# Patient Record
Sex: Female | Born: 1960 | Race: White | Hispanic: No | Marital: Married | Smoking: Never smoker
Health system: Southern US, Community
[De-identification: ages and names within clinical notes are randomized; demographics above are authoritative.]

## PROBLEM LIST (undated history)

## (undated) DIAGNOSIS — L309 Dermatitis, unspecified: Secondary | ICD-10-CM

## (undated) DIAGNOSIS — M1990 Unspecified osteoarthritis, unspecified site: Secondary | ICD-10-CM

## (undated) DIAGNOSIS — I10 Essential (primary) hypertension: Secondary | ICD-10-CM

## (undated) DIAGNOSIS — F988 Other specified behavioral and emotional disorders with onset usually occurring in childhood and adolescence: Secondary | ICD-10-CM

## (undated) DIAGNOSIS — F329 Major depressive disorder, single episode, unspecified: Secondary | ICD-10-CM

## (undated) DIAGNOSIS — F32A Depression, unspecified: Secondary | ICD-10-CM

## (undated) DIAGNOSIS — Z9889 Other specified postprocedural states: Secondary | ICD-10-CM

## (undated) DIAGNOSIS — T4145XA Adverse effect of unspecified anesthetic, initial encounter: Secondary | ICD-10-CM

## (undated) DIAGNOSIS — R112 Nausea with vomiting, unspecified: Secondary | ICD-10-CM

## (undated) DIAGNOSIS — T8859XA Other complications of anesthesia, initial encounter: Secondary | ICD-10-CM

## (undated) HISTORY — PX: KNEE ARTHROSCOPY: SUR90

## (undated) HISTORY — PX: ABDOMINAL HYSTERECTOMY: SHX81

## (undated) HISTORY — PX: TUBAL LIGATION: SHX77

## (undated) HISTORY — PX: COLONOSCOPY: SHX174

---

## 2001-04-01 ENCOUNTER — Other Ambulatory Visit: Admission: RE | Admit: 2001-04-01 | Discharge: 2001-04-01 | Payer: Self-pay | Admitting: Family Medicine

## 2002-11-18 ENCOUNTER — Encounter: Admission: RE | Admit: 2002-11-18 | Discharge: 2002-11-18 | Payer: Self-pay | Admitting: Family Medicine

## 2002-11-18 ENCOUNTER — Encounter: Payer: Self-pay | Admitting: Family Medicine

## 2003-05-03 ENCOUNTER — Encounter: Admission: RE | Admit: 2003-05-03 | Discharge: 2003-05-03 | Payer: Self-pay | Admitting: Family Medicine

## 2003-06-29 ENCOUNTER — Other Ambulatory Visit: Admission: RE | Admit: 2003-06-29 | Discharge: 2003-06-29 | Payer: Self-pay | Admitting: Family Medicine

## 2004-08-11 ENCOUNTER — Other Ambulatory Visit: Admission: RE | Admit: 2004-08-11 | Discharge: 2004-08-11 | Payer: Self-pay | Admitting: Family Medicine

## 2005-10-02 ENCOUNTER — Other Ambulatory Visit: Admission: RE | Admit: 2005-10-02 | Discharge: 2005-10-02 | Payer: Self-pay | Admitting: Family Medicine

## 2006-10-15 ENCOUNTER — Other Ambulatory Visit: Admission: RE | Admit: 2006-10-15 | Discharge: 2006-10-15 | Payer: Self-pay | Admitting: Family Medicine

## 2007-03-12 ENCOUNTER — Ambulatory Visit (HOSPITAL_COMMUNITY): Admission: RE | Admit: 2007-03-12 | Discharge: 2007-03-12 | Payer: Self-pay | Admitting: *Deleted

## 2007-03-12 ENCOUNTER — Encounter (INDEPENDENT_AMBULATORY_CARE_PROVIDER_SITE_OTHER): Payer: Self-pay | Admitting: *Deleted

## 2010-11-07 NOTE — Op Note (Signed)
NAMEJODELL, Taylor [name]              ACCOUNT NO.:  [id number]   MEDICAL RECORD NO.:  [id number]          PATIENT TYPE:  AMB   LOCATION:  SDC                           FACILITY:  [facility]   PHYSICIAN:  Marietta B. Earlene Plater, M.D.  DATE OF BIRTH:  1960/09/11   DATE OF PROCEDURE:  03/12/2007  DATE OF DISCHARGE:                               OPERATIVE REPORT   PREOPERATIVE DIAGNOSIS:  Abnormal uterine bleeding.   POSTOPERATIVE DIAGNOSIS:  Abnormal uterine bleeding.   PROCEDURE PERFORMED:  Total laparoscopic hysterectomy.   SURGEON:  Chester Holstein. Earlene Plater, M.D.   ASSISTANTMarcial Pacas P. Fontaine, M.D.   ANESTHESIA:  General.   FINDINGS:  Approximately 12 weeks size uterus consistent with  adenomyosis.   SPECIMEN:  Uterus and cervix to pathology.   BLOOD LOSS:  150 mL.   COMPLICATIONS:  None.   INDICATIONS:  The patient has a history of excessive menstrual bleeding.  Sonohysterogram was normal and there was no evidence of uterine  fibroids, although the uterus was about 12 weeks size.  Clinical  impression was adenomyosis.  The patient was offered a range of  therapeutic options from birth control pills to Progestrone IUD to  endometrial ablation and ultimately hysterectomy.  The patient chose  hysterectomy.  The patient was advised the risks of surgery, including  infection, bleeding, and damage to surrounding organs.   PROCEDURE:  The patient was taken to the operating room and general  anesthesia obtained.  She was prepped and draped in a standard fashion.  A Foley catheter was inserted into the bladder.  A 10 mm incision was  placed in the umbilicus, the fascia divided sharply and elevated.  The  posterior sheath elevated and entered sharply.  A pursestring suture of  0 Vicryl placed around the fascial defect.  Hassan cannula inserted and  secured.  Pneumoperitoneum obtained with CO2 gas. 11 mm XL trocar placed  in left lower quadrant and a 5 mm in the right, each under direct  laparoscopic  visualization.  The uterus was mobilized anteriorly with  the Rumi. The uterus was previously sounded to 8 cm and the #8 Rumi tip  inserted and secured.   The course of the each ureter was identified and found to be well away.  The left round ligament was placed on traction, sealed and divided with  the gyrus cutting forceps.  Tube and the utero-ovarian pedicles  similarly sealed and divided.  Bladder flap created sharply with the  gyrus.  The uterine artery was skeletonized, sealed and divided on each  side with the gyrus.   The uterus was deviated posteriorly and the Kohcup elevated, anterior  colpotomy made with the plasma spatula.  The uterus was then deviated  anteriorly and posterior colpotomy made with the spatula.  The angles  were taken down the gyrus bipolar with hemostasis obtained.  The uterus  was delivered via the vagina and left in the uterus to maintain  pneumoperitoneum.  The vagina was then closed with interrupted plain  sutures of 0 Vicryl with hemostasis obtained.  The pelvis was irrigated.  The pneumoperitoneum was  taken down and the cuff inspected and found to  be hemostatic.   The inferior ports were removed and their sites were hemostatic.  The  scope was removed, gas released, Hassan cannula removed.  The umbilical  incision was elevated with the [employer] retractors.  Pursestring suture  was snugged down.  This obliterated the fascial defect and no intra-  abdominal contents herniated through prior to closure.  The subcutaneous  tissue was reapproximated with 4-0 Vicryl.  The skin was closed at each  site with Dermabond.  The patient tolerated the procedure well and there  were no complications.  The patient was taken to the recovery room in  stable condition.      Gerri Spore B. Earlene Plater, M.D.  Electronically Signed     WBD/MEDQ  D:  03/12/2007  T:  03/12/2007  Job:  [id number]

## 2011-04-05 LAB — CBC
HCT: 39.8
Hemoglobin: 13.4
MCHC: 33.6
MCV: 87.1
Platelets: 430 — ABNORMAL HIGH
RBC: 4.57
RDW: 13.4
WBC: 7.3

## 2011-04-05 LAB — DIFFERENTIAL
Basophils Absolute: 0
Basophils Relative: 1
Eosinophils Absolute: 0.1
Eosinophils Relative: 1
Lymphocytes Relative: 33
Lymphs Abs: 2.4
Monocytes Absolute: 0.6
Monocytes Relative: 8
Neutro Abs: 4.2
Neutrophils Relative %: 57

## 2011-04-05 LAB — PREGNANCY, URINE: Preg Test, Ur: NEGATIVE

## 2012-03-13 ENCOUNTER — Other Ambulatory Visit: Payer: Self-pay | Admitting: Family Medicine

## 2012-03-13 DIAGNOSIS — IMO0002 Reserved for concepts with insufficient information to code with codable children: Secondary | ICD-10-CM

## 2012-03-14 ENCOUNTER — Ambulatory Visit
Admission: RE | Admit: 2012-03-14 | Discharge: 2012-03-14 | Disposition: A | Discharge status: Home / Self Care | Payer: 59 | Source: Ambulatory Visit | Attending: Family Medicine | Admitting: Family Medicine

## 2012-03-14 DIAGNOSIS — IMO0002 Reserved for concepts with insufficient information to code with codable children: Secondary | ICD-10-CM

## 2013-06-03 ENCOUNTER — Other Ambulatory Visit: Payer: Self-pay | Admitting: Gastroenterology

## 2013-06-03 DIAGNOSIS — R109 Unspecified abdominal pain: Secondary | ICD-10-CM

## 2013-06-03 DIAGNOSIS — R112 Nausea with vomiting, unspecified: Secondary | ICD-10-CM

## 2013-06-05 ENCOUNTER — Ambulatory Visit
Admission: RE | Admit: 2013-06-05 | Discharge: 2013-06-05 | Disposition: A | Discharge status: Home / Self Care | Payer: 59 | Source: Ambulatory Visit | Attending: Gastroenterology | Admitting: Gastroenterology

## 2013-06-05 DIAGNOSIS — R112 Nausea with vomiting, unspecified: Secondary | ICD-10-CM

## 2013-06-05 DIAGNOSIS — R109 Unspecified abdominal pain: Secondary | ICD-10-CM

## 2013-08-17 ENCOUNTER — Encounter (HOSPITAL_COMMUNITY): Payer: Self-pay | Admitting: Pharmacy Technician

## 2013-08-18 ENCOUNTER — Other Ambulatory Visit (HOSPITAL_COMMUNITY): Payer: Self-pay | Admitting: *Deleted

## 2013-08-18 NOTE — Patient Instructions (Addendum)
Gwenith DailyRebecca Ruble  08/18/2013                           YOUR PROCEDURE IS SCHEDULED ON:  08/25/13               PLEASE REPORT TO SHORT STAY CENTER AT : 2:00 PM               CALL THIS NUMBER IF ANY PROBLEMS THE DAY OF SURGERY :               [phone]                                REMEMBER:   Do not eat food or drink liquids AFTER MIDNIGHT   May have clear liquids UNTIL 6 HOURS BEFORE SURGERY (11:00 AM)               Take these medicines the morning of surgery with A SIP OF WATER:  WELLBUTRIN / ZOLOFT   Do not wear jewelry, make-up   Do not wear lotions, powders, or perfumes.   Do not shave legs or underarms 12 hrs. before surgery (men may shave face)  Do not bring valuables to the hospital.  Contacts, dentures or bridgework may not be worn into surgery.  Leave suitcase in the car. After surgery it may be brought to your room.  For patients admitted to the hospital more than one night, checkout time is 11:00  AM                                                       The day of discharge.   Patients discharged the day of surgery will not be allowed to drive home.                If going home same day of surgery, must have someone stay with you            FIRST 24 hrs at home and arrange for some one to drive you home from hospital.    Special Instructions             Please read over the following fact sheets that you were given:               1. [place] PREPARING FOR SURGERY SHEET                2. INCENTIVE SPIROMETER                3. MRSA INFORMATION SHEET                                                X_____________________________________________________________________        Failure to follow these instructions may result in cancellation of your surgery

## 2013-08-19 ENCOUNTER — Ambulatory Visit (HOSPITAL_COMMUNITY)
Admission: RE | Admit: 2013-08-19 | Discharge: 2013-08-19 | Disposition: A | Discharge status: Home / Self Care | Payer: 59 | Source: Ambulatory Visit | Attending: Orthopedic Surgery | Admitting: Orthopedic Surgery

## 2013-08-19 ENCOUNTER — Encounter (HOSPITAL_COMMUNITY): Payer: Self-pay

## 2013-08-19 ENCOUNTER — Encounter (HOSPITAL_COMMUNITY)
Admission: RE | Admit: 2013-08-19 | Discharge: 2013-08-19 | Disposition: A | Discharge status: Home / Self Care | Payer: 59 | Source: Ambulatory Visit | Attending: Orthopedic Surgery | Admitting: Orthopedic Surgery

## 2013-08-19 ENCOUNTER — Encounter (INDEPENDENT_AMBULATORY_CARE_PROVIDER_SITE_OTHER): Payer: Self-pay

## 2013-08-19 DIAGNOSIS — R222 Localized swelling, mass and lump, trunk: Secondary | ICD-10-CM | POA: Insufficient documentation

## 2013-08-19 DIAGNOSIS — Z01818 Encounter for other preprocedural examination: Secondary | ICD-10-CM | POA: Insufficient documentation

## 2013-08-19 DIAGNOSIS — Z01812 Encounter for preprocedural laboratory examination: Secondary | ICD-10-CM | POA: Insufficient documentation

## 2013-08-19 HISTORY — DX: Other specified postprocedural states: Z98.890

## 2013-08-19 HISTORY — DX: Other complications of anesthesia, initial encounter: T88.59XA

## 2013-08-19 HISTORY — DX: Nausea with vomiting, unspecified: R11.2

## 2013-08-19 HISTORY — DX: Adverse effect of unspecified anesthetic, initial encounter: T41.45XA

## 2013-08-19 HISTORY — DX: Major depressive disorder, single episode, unspecified: F32.9

## 2013-08-19 HISTORY — DX: Depression, unspecified: F32.A

## 2013-08-19 HISTORY — DX: Essential (primary) hypertension: I10

## 2013-08-19 HISTORY — DX: Other specified behavioral and emotional disorders with onset usually occurring in childhood and adolescence: F98.8

## 2013-08-19 HISTORY — DX: Unspecified osteoarthritis, unspecified site: M19.90

## 2013-08-19 HISTORY — DX: Dermatitis, unspecified: L30.9

## 2013-08-19 LAB — BASIC METABOLIC PANEL
BUN: 10 mg/dL (ref 6–23)
CO2: 29 meq/L (ref 19–32)
CREATININE: 0.86 mg/dL (ref 0.50–1.10)
Calcium: 9.4 mg/dL (ref 8.4–10.5)
Chloride: 97 mEq/L (ref 96–112)
GFR calc Af Amer: 88 mL/min — ABNORMAL LOW (ref >90)
GFR calc non Af Amer: 76 mL/min — ABNORMAL LOW (ref >90)
Glucose, Bld: 101 mg/dL — ABNORMAL HIGH (ref 70–99)
Potassium: 3.6 mEq/L — ABNORMAL LOW (ref 3.7–5.3)
SODIUM: 136 meq/L — AB (ref 137–147)

## 2013-08-19 LAB — CBC
HEMATOCRIT: 40.1 % (ref 36.0–46.0)
HEMOGLOBIN: 13.6 g/dL (ref 12.0–15.0)
MCH: 29.8 pg (ref 26.0–34.0)
MCHC: 33.9 g/dL (ref 30.0–36.0)
MCV: 87.9 fL (ref 78.0–100.0)
Platelets: 391 10*3/uL (ref 150–400)
RBC: 4.56 MIL/uL (ref 3.87–5.11)
RDW: 12.4 % (ref 11.5–15.5)
WBC: 7.8 10*3/uL (ref 4.0–10.5)

## 2013-08-19 LAB — URINALYSIS, ROUTINE W REFLEX MICROSCOPIC
Bilirubin Urine: NEGATIVE
Glucose, UA: NEGATIVE mg/dL
Hgb urine dipstick: NEGATIVE
KETONES UR: NEGATIVE mg/dL
Leukocytes, UA: NEGATIVE
Nitrite: NEGATIVE
PROTEIN: NEGATIVE mg/dL
Specific Gravity, Urine: 1.018 (ref 1.005–1.030)
UROBILINOGEN UA: 0.2 mg/dL (ref 0.0–1.0)
pH: 7 (ref 5.0–8.0)

## 2013-08-19 LAB — SURGICAL PCR SCREEN
MRSA, PCR: NEGATIVE
Staphylococcus aureus: NEGATIVE

## 2013-08-19 LAB — PROTIME-INR
INR: 0.99 (ref 0.00–1.49)
Prothrombin Time: 12.9 seconds (ref 11.6–15.2)

## 2013-08-19 LAB — APTT: aPTT: 29 seconds (ref 24–37)

## 2013-08-19 LAB — ABO/RH: ABO/RH(D): A POS

## 2013-08-19 NOTE — Progress Notes (Signed)
Abnormal CXR faxed to Dr. Olin 

## 2013-08-22 NOTE — H&P (Signed)
TOTAL HIP ADMISSION H&P  Patient is admitted for left total hip arthroplasty, anterior approach.  Subjective:  Chief Complaint:     Left hip OA / pain  HPI: Taylor [name], 53 y.o. female, has a history of pain and functional disability in the left hip(s) due to arthritis and patient has failed non-surgical conservative treatments for greater than 12 weeks to include NSAID's and/or analgesics, corticosteriod injections and activity modification.  Onset of symptoms was gradual starting 2+ years ago with gradually worsening course since that time.The patient noted no past surgery on the left hip(s).  Patient currently rates pain in the left hip at 7 out of 10 with activity. Patient has night pain, worsening of pain with activity and weight bearing, trendelenberg gait, pain that interfers with activities of daily living and pain with passive range of motion. Patient has evidence of periarticular osteophytes and joint space narrowing by imaging studies. This condition presents safety issues increasing the risk of falls. There is no current active infection.  Risks, benefits and expectations were discussed with the patient.  Risks including but not limited to the risk of anesthesia, blood clots, nerve damage, blood vessel damage, failure of the prosthesis, infection and up to and including death.  Patient understand the risks, benefits and expectations and wishes to proceed with surgery.   D/C Plans:   Home with HHPT  Post-op Meds:    No Rx given  Tranexamic Acid:   To be given  Decadron:    To be given  FYI:    ASA post-op  Oxycodone post-op    Past Medical History  Diagnosis Date  . Complication of anesthesia   . PONV (postoperative nausea and vomiting)   . Hypertension   . Arthritis   . Eczema   . ADD (attention deficit disorder)   . Depression     Past Surgical History  Procedure Laterality Date  . Abdominal hysterectomy    . Knee arthroscopy  2003 / 2014    rt    No  prescriptions prior to admission   No Known Allergies   History  Substance Use Topics  . Smoking status: Never Smoker   . Smokeless tobacco: Not on file  . Alcohol Use: Yes     Comment: occasional    No family history on file.   Review of Systems  Constitutional: Positive for malaise/fatigue.  HENT: Negative.   Eyes: Negative.   Respiratory: Positive for shortness of breath (on exertion).   Cardiovascular: Negative.   Gastrointestinal: Negative.   Genitourinary: Negative.   Musculoskeletal: Positive for back pain and joint pain.  Skin: Positive for rash.  Neurological: Negative.   Endo/Heme/Allergies: Negative.   Psychiatric/Behavioral: Negative.     Objective:  Physical Exam  Constitutional: She is oriented to person, place, and time. She appears well-developed and well-nourished.  HENT:  Head: Normocephalic and atraumatic.  Mouth/Throat: Oropharynx is clear and moist.  Eyes: Pupils are equal, round, and reactive to light.  Neck: Neck supple. No JVD present. No tracheal deviation present. No thyromegaly present.  Cardiovascular: Normal rate, regular rhythm, normal heart sounds and intact distal pulses.   Respiratory: Effort normal and breath sounds normal. No stridor. No respiratory distress. She has no wheezes.  GI: Soft. There is no tenderness. There is no guarding.  Musculoskeletal:       Left hip: She exhibits decreased range of motion, decreased strength, tenderness and bony tenderness. She exhibits no swelling, no deformity and no laceration.  Lymphadenopathy:  She has no cervical adenopathy.  Neurological: She is alert and oriented to person, place, and time.  Skin: Skin is warm and dry.  Psychiatric: She has a normal mood and affect.     Imaging Review Plain radiographs demonstrate severe degenerative joint disease of the left hip(s). The bone quality appears to be good for age and reported activity level.  Assessment/Plan:  End stage arthritis, left  hip(s)  The patient history, physical examination, clinical judgement of the provider and imaging studies are consistent with end stage degenerative joint disease of the left hip(s) and total hip arthroplasty is deemed medically necessary. The treatment options including medical management, injection therapy, arthroscopy and arthroplasty were discussed at length. The risks and benefits of total hip arthroplasty were presented and reviewed. The risks due to aseptic loosening, infection, stiffness, dislocation/subluxation,  thromboembolic complications and other imponderables were discussed.  The patient acknowledged the explanation, agreed to proceed with the plan and consent was signed. Patient is being admitted for inpatient treatment for surgery, pain control, PT, OT, prophylactic antibiotics, VTE prophylaxis, progressive ambulation and ADL's and discharge planning.The patient is planning to be discharged home with home health services.     Anastasio AuerbachMatthew S. Numa Heatwole   PAC  08/22/2013, 11:58 AM

## 2013-08-25 ENCOUNTER — Inpatient Hospital Stay (HOSPITAL_COMMUNITY): Payer: 59 | Admitting: Anesthesiology

## 2013-08-25 ENCOUNTER — Inpatient Hospital Stay (HOSPITAL_COMMUNITY): Payer: 59

## 2013-08-25 ENCOUNTER — Inpatient Hospital Stay (HOSPITAL_COMMUNITY)
Admission: RE | Admit: 2013-08-25 | Discharge: 2013-08-26 | DRG: 470 | Disposition: A | Discharge status: Home Health | Payer: 59 | Source: Ambulatory Visit | Attending: Orthopedic Surgery | Admitting: Orthopedic Surgery

## 2013-08-25 ENCOUNTER — Encounter (HOSPITAL_COMMUNITY)
Admission: RE | Disposition: A | Discharge status: Home Health | Payer: Self-pay | Source: Ambulatory Visit | Attending: Orthopedic Surgery

## 2013-08-25 ENCOUNTER — Encounter (HOSPITAL_COMMUNITY): Payer: Self-pay | Admitting: *Deleted

## 2013-08-25 ENCOUNTER — Encounter (HOSPITAL_COMMUNITY): Payer: 59 | Admitting: Anesthesiology

## 2013-08-25 DIAGNOSIS — Z6830 Body mass index (BMI) 30.0-30.9, adult: Secondary | ICD-10-CM

## 2013-08-25 DIAGNOSIS — M169 Osteoarthritis of hip, unspecified: Principal | ICD-10-CM | POA: Diagnosis present

## 2013-08-25 DIAGNOSIS — D62 Acute posthemorrhagic anemia: Secondary | ICD-10-CM | POA: Diagnosis not present

## 2013-08-25 DIAGNOSIS — F329 Major depressive disorder, single episode, unspecified: Secondary | ICD-10-CM | POA: Diagnosis present

## 2013-08-25 DIAGNOSIS — F3289 Other specified depressive episodes: Secondary | ICD-10-CM | POA: Diagnosis present

## 2013-08-25 DIAGNOSIS — Z96649 Presence of unspecified artificial hip joint: Secondary | ICD-10-CM

## 2013-08-25 DIAGNOSIS — F988 Other specified behavioral and emotional disorders with onset usually occurring in childhood and adolescence: Secondary | ICD-10-CM | POA: Diagnosis present

## 2013-08-25 DIAGNOSIS — I10 Essential (primary) hypertension: Secondary | ICD-10-CM | POA: Diagnosis present

## 2013-08-25 DIAGNOSIS — E669 Obesity, unspecified: Secondary | ICD-10-CM | POA: Diagnosis present

## 2013-08-25 DIAGNOSIS — D500 Iron deficiency anemia secondary to blood loss (chronic): Secondary | ICD-10-CM | POA: Diagnosis not present

## 2013-08-25 DIAGNOSIS — M1610 Unilateral primary osteoarthritis, unspecified hip: Principal | ICD-10-CM | POA: Diagnosis present

## 2013-08-25 HISTORY — PX: TOTAL HIP ARTHROPLASTY: SHX124

## 2013-08-25 LAB — TYPE AND SCREEN
ABO/RH(D): A POS
ANTIBODY SCREEN: NEGATIVE

## 2013-08-25 SURGERY — ARTHROPLASTY, HIP, TOTAL, ANTERIOR APPROACH
Anesthesia: Spinal | Site: Hip | Laterality: Left

## 2013-08-25 MED ORDER — ASPIRIN EC 325 MG PO TBEC
325.0000 mg | DELAYED_RELEASE_TABLET | Freq: Two times a day (BID) | ORAL | Status: DC
  Administered 2013-08-26: 325 mg via ORAL
  Filled 2013-08-25 (×3): qty 1

## 2013-08-25 MED ORDER — SERTRALINE HCL 25 MG PO TABS
25.0000 mg | ORAL_TABLET | Freq: Every morning | ORAL | Status: DC
  Administered 2013-08-26: 25 mg via ORAL
  Filled 2013-08-25: qty 1

## 2013-08-25 MED ORDER — PHENYLEPHRINE HCL 10 MG/ML IJ SOLN
INTRAMUSCULAR | Status: DC | PRN
  Administered 2013-08-25 (×2): 80 ug via INTRAVENOUS
  Administered 2013-08-25 (×2): 40 ug via INTRAVENOUS

## 2013-08-25 MED ORDER — DEXAMETHASONE SODIUM PHOSPHATE 10 MG/ML IJ SOLN
INTRAMUSCULAR | Status: AC
  Filled 2013-08-25: qty 1

## 2013-08-25 MED ORDER — DOCUSATE SODIUM 100 MG PO CAPS
100.0000 mg | ORAL_CAPSULE | Freq: Two times a day (BID) | ORAL | Status: DC
  Administered 2013-08-25 – 2013-08-26 (×2): 100 mg via ORAL

## 2013-08-25 MED ORDER — ALUM & MAG HYDROXIDE-SIMETH 200-200-20 MG/5ML PO SUSP: 30.0000 mL | ORAL | Status: DC | PRN

## 2013-08-25 MED ORDER — CEFAZOLIN SODIUM-DEXTROSE 2-3 GM-% IV SOLR
2.0000 g | INTRAVENOUS | Status: AC
  Administered 2013-08-25: 2 g via INTRAVENOUS

## 2013-08-25 MED ORDER — METHOCARBAMOL 100 MG/ML IJ SOLN
500.0000 mg | Freq: Four times a day (QID) | INTRAVENOUS | Status: DC | PRN
  Administered 2013-08-25: 500 mg via INTRAVENOUS
  Filled 2013-08-25: qty 5

## 2013-08-25 MED ORDER — LACTATED RINGERS IV SOLN
INTRAVENOUS | Status: DC | PRN
  Administered 2013-08-25 (×3): via INTRAVENOUS

## 2013-08-25 MED ORDER — FLEET ENEMA 7-19 GM/118ML RE ENEM: 1.0000 | ENEMA | Freq: Once | RECTAL | Status: AC | PRN

## 2013-08-25 MED ORDER — CEFAZOLIN SODIUM-DEXTROSE 2-3 GM-% IV SOLR
INTRAVENOUS | Status: AC
  Filled 2013-08-25: qty 50

## 2013-08-25 MED ORDER — IRBESARTAN 150 MG PO TABS
150.0000 mg | ORAL_TABLET | Freq: Every day | ORAL | Status: DC
Start: 2013-08-25 — End: 2013-08-26
  Administered 2013-08-25 – 2013-08-26 (×2): 150 mg via ORAL
  Filled 2013-08-25 (×2): qty 1

## 2013-08-25 MED ORDER — METOCLOPRAMIDE HCL 10 MG PO TABS: 5.0000 mg | ORAL_TABLET | Freq: Three times a day (TID) | ORAL | Status: DC | PRN

## 2013-08-25 MED ORDER — ONDANSETRON HCL 4 MG/2ML IJ SOLN
INTRAMUSCULAR | Status: DC | PRN
  Administered 2013-08-25: 4 mg via INTRAVENOUS

## 2013-08-25 MED ORDER — PHENYLEPHRINE 40 MCG/ML (10ML) SYRINGE FOR IV PUSH (FOR BLOOD PRESSURE SUPPORT)
PREFILLED_SYRINGE | INTRAVENOUS | Status: AC
  Filled 2013-08-25: qty 10

## 2013-08-25 MED ORDER — OXYCODONE HCL 5 MG PO TABS: 5.0000 mg | ORAL_TABLET | Freq: Once | ORAL | Status: DC | PRN

## 2013-08-25 MED ORDER — PROPOFOL 10 MG/ML IV BOLUS
INTRAVENOUS | Status: AC
  Filled 2013-08-25: qty 20

## 2013-08-25 MED ORDER — SODIUM CHLORIDE 0.9 % IV SOLN
100.0000 mL/h | INTRAVENOUS | Status: DC
  Administered 2013-08-25: 100 mL/h via INTRAVENOUS
  Filled 2013-08-25 (×3): qty 1000

## 2013-08-25 MED ORDER — MIDAZOLAM HCL 5 MG/5ML IJ SOLN
INTRAMUSCULAR | Status: DC | PRN
  Administered 2013-08-25: 2 mg via INTRAVENOUS

## 2013-08-25 MED ORDER — PHENOL 1.4 % MT LIQD
1.0000 | OROMUCOSAL | Status: DC | PRN
  Filled 2013-08-25: qty 177

## 2013-08-25 MED ORDER — HYDROMORPHONE HCL PF 1 MG/ML IJ SOLN: 0.2500 mg | INTRAMUSCULAR | Status: DC | PRN

## 2013-08-25 MED ORDER — SODIUM CHLORIDE 0.9 % IV SOLN
10.0000 mg | INTRAVENOUS | Status: DC | PRN
  Administered 2013-08-25: 50 ug/min via INTRAVENOUS

## 2013-08-25 MED ORDER — PHENYLEPHRINE HCL 10 MG/ML IJ SOLN
INTRAMUSCULAR | Status: AC
  Filled 2013-08-25: qty 1

## 2013-08-25 MED ORDER — CELECOXIB 200 MG PO CAPS
200.0000 mg | ORAL_CAPSULE | Freq: Two times a day (BID) | ORAL | Status: DC
  Administered 2013-08-25: 200 mg via ORAL
  Filled 2013-08-25 (×3): qty 1

## 2013-08-25 MED ORDER — STERILE WATER FOR IRRIGATION IR SOLN
Status: DC | PRN
Start: 2013-08-25 — End: 2013-08-25
  Administered 2013-08-25: 1500 mL

## 2013-08-25 MED ORDER — AMPHETAMINE-DEXTROAMPHETAMINE 10 MG PO TABS
20.0000 mg | ORAL_TABLET | Freq: Two times a day (BID) | ORAL | Status: DC
  Filled 2013-08-25: qty 2

## 2013-08-25 MED ORDER — FENTANYL CITRATE 0.05 MG/ML IJ SOLN
INTRAMUSCULAR | Status: AC
  Filled 2013-08-25: qty 2

## 2013-08-25 MED ORDER — PROPOFOL INFUSION 10 MG/ML OPTIME
INTRAVENOUS | Status: DC | PRN
  Administered 2013-08-25: 140 ug/kg/min via INTRAVENOUS

## 2013-08-25 MED ORDER — CHLORHEXIDINE GLUCONATE 4 % EX LIQD: 60.0000 mL | Freq: Once | CUTANEOUS | Status: DC

## 2013-08-25 MED ORDER — MEPERIDINE HCL 50 MG/ML IJ SOLN: 6.2500 mg | INTRAMUSCULAR | Status: DC | PRN

## 2013-08-25 MED ORDER — DEXAMETHASONE SODIUM PHOSPHATE 10 MG/ML IJ SOLN
10.0000 mg | Freq: Once | INTRAMUSCULAR | Status: AC
  Administered 2013-08-25: 10 mg via INTRAVENOUS

## 2013-08-25 MED ORDER — LIDOCAINE HCL (CARDIAC) 20 MG/ML IV SOLN
INTRAVENOUS | Status: DC | PRN
  Administered 2013-08-25: 100 mg via INTRAVENOUS

## 2013-08-25 MED ORDER — BUPROPION HCL ER (XL) 150 MG PO TB24
150.0000 mg | ORAL_TABLET | Freq: Every morning | ORAL | Status: DC
  Administered 2013-08-26: 150 mg via ORAL
  Filled 2013-08-25: qty 1

## 2013-08-25 MED ORDER — POLYETHYLENE GLYCOL 3350 17 G PO PACK
17.0000 g | PACK | Freq: Two times a day (BID) | ORAL | Status: DC
  Administered 2013-08-25 – 2013-08-26 (×2): 17 g via ORAL

## 2013-08-25 MED ORDER — ONDANSETRON HCL 4 MG/2ML IJ SOLN
4.0000 mg | Freq: Four times a day (QID) | INTRAMUSCULAR | Status: DC | PRN
  Administered 2013-08-25 – 2013-08-26 (×2): 4 mg via INTRAVENOUS
  Filled 2013-08-25 (×2): qty 2

## 2013-08-25 MED ORDER — MENTHOL 3 MG MT LOZG
1.0000 | LOZENGE | OROMUCOSAL | Status: DC | PRN
  Filled 2013-08-25: qty 9

## 2013-08-25 MED ORDER — HYDROCHLOROTHIAZIDE 25 MG PO TABS
25.0000 mg | ORAL_TABLET | Freq: Every morning | ORAL | Status: DC
  Administered 2013-08-26: 25 mg via ORAL
  Filled 2013-08-25: qty 1

## 2013-08-25 MED ORDER — SODIUM CHLORIDE 0.9 % IJ SOLN
INTRAMUSCULAR | Status: AC
  Filled 2013-08-25: qty 10

## 2013-08-25 MED ORDER — ONDANSETRON HCL 4 MG PO TABS
4.0000 mg | ORAL_TABLET | Freq: Four times a day (QID) | ORAL | Status: DC | PRN
Start: 2013-08-25 — End: 2013-08-26

## 2013-08-25 MED ORDER — CEFAZOLIN SODIUM-DEXTROSE 2-3 GM-% IV SOLR
2.0000 g | Freq: Four times a day (QID) | INTRAVENOUS | Status: AC
  Administered 2013-08-25 – 2013-08-26 (×2): 2 g via INTRAVENOUS
  Filled 2013-08-25 (×2): qty 50

## 2013-08-25 MED ORDER — METHOCARBAMOL 500 MG PO TABS
500.0000 mg | ORAL_TABLET | Freq: Four times a day (QID) | ORAL | Status: DC | PRN
  Administered 2013-08-26: 500 mg via ORAL
  Filled 2013-08-25: qty 1

## 2013-08-25 MED ORDER — FENTANYL CITRATE 0.05 MG/ML IJ SOLN
INTRAMUSCULAR | Status: DC | PRN
  Administered 2013-08-25 (×2): 50 ug via INTRAVENOUS

## 2013-08-25 MED ORDER — HYDROMORPHONE HCL PF 1 MG/ML IJ SOLN
0.5000 mg | INTRAMUSCULAR | Status: DC | PRN
  Administered 2013-08-25 – 2013-08-26 (×3): 1 mg via INTRAVENOUS
  Filled 2013-08-25 (×3): qty 1

## 2013-08-25 MED ORDER — MIDAZOLAM HCL 2 MG/2ML IJ SOLN
INTRAMUSCULAR | Status: AC
  Filled 2013-08-25: qty 2

## 2013-08-25 MED ORDER — FERROUS SULFATE 325 (65 FE) MG PO TABS
325.0000 mg | ORAL_TABLET | Freq: Three times a day (TID) | ORAL | Status: DC
  Administered 2013-08-26 (×2): 325 mg via ORAL
  Filled 2013-08-25 (×4): qty 1

## 2013-08-25 MED ORDER — ONDANSETRON HCL 4 MG/2ML IJ SOLN
INTRAMUSCULAR | Status: AC
Start: 2013-08-25 — End: 2013-08-25
  Filled 2013-08-25: qty 2

## 2013-08-25 MED ORDER — 0.9 % SODIUM CHLORIDE (POUR BTL) OPTIME
TOPICAL | Status: DC | PRN
  Administered 2013-08-25: 1000 mL

## 2013-08-25 MED ORDER — PROMETHAZINE HCL 25 MG/ML IJ SOLN: 6.2500 mg | INTRAMUSCULAR | Status: DC | PRN

## 2013-08-25 MED ORDER — OXYCODONE HCL 5 MG/5ML PO SOLN
5.0000 mg | Freq: Once | ORAL | Status: DC | PRN
  Filled 2013-08-25: qty 5

## 2013-08-25 MED ORDER — DIPHENHYDRAMINE HCL 25 MG PO CAPS
25.0000 mg | ORAL_CAPSULE | Freq: Four times a day (QID) | ORAL | Status: DC | PRN
Start: 2013-08-25 — End: 2013-08-26

## 2013-08-25 MED ORDER — EPHEDRINE SULFATE 50 MG/ML IJ SOLN
INTRAMUSCULAR | Status: AC
  Filled 2013-08-25: qty 1

## 2013-08-25 MED ORDER — BISACODYL 10 MG RE SUPP
10.0000 mg | Freq: Every day | RECTAL | Status: DC | PRN
Start: 2013-08-25 — End: 2013-08-26

## 2013-08-25 MED ORDER — DEXAMETHASONE SODIUM PHOSPHATE 10 MG/ML IJ SOLN
10.0000 mg | Freq: Once | INTRAMUSCULAR | Status: DC
  Filled 2013-08-25: qty 1

## 2013-08-25 MED ORDER — SODIUM CHLORIDE 0.9 % IV SOLN
1000.0000 mg | Freq: Once | INTRAVENOUS | Status: AC
  Administered 2013-08-25: 1000 mg via INTRAVENOUS
  Filled 2013-08-25: qty 10

## 2013-08-25 MED ORDER — OXYCODONE HCL 5 MG PO TABS
5.0000 mg | ORAL_TABLET | ORAL | Status: DC
  Administered 2013-08-25 (×2): 5 mg via ORAL
  Administered 2013-08-26: 10 mg via ORAL
  Administered 2013-08-26 (×3): 15 mg via ORAL
  Filled 2013-08-25: qty 2
  Filled 2013-08-25 (×2): qty 1
  Filled 2013-08-25 (×3): qty 3

## 2013-08-25 MED ORDER — BUPIVACAINE IN DEXTROSE 0.75-8.25 % IT SOLN
INTRATHECAL | Status: DC | PRN
  Administered 2013-08-25: 2 mL via INTRATHECAL

## 2013-08-25 MED ORDER — LIDOCAINE HCL (CARDIAC) 20 MG/ML IV SOLN
INTRAVENOUS | Status: AC
  Filled 2013-08-25: qty 5

## 2013-08-25 MED ORDER — METOCLOPRAMIDE HCL 5 MG/ML IJ SOLN: 5.0000 mg | Freq: Three times a day (TID) | INTRAMUSCULAR | Status: DC | PRN

## 2013-08-25 SURGICAL SUPPLY — 38 items
BAG ZIPLOCK 12X15 (MISCELLANEOUS) IMPLANT
BLADE SAW SGTL 18X1.27X75 (BLADE) ×2 IMPLANT
BLADE SAW SGTL 18X1.27X75MM (BLADE) ×1
CAPT HIP PF COP ×3 IMPLANT
DERMABOND ADVANCED (GAUZE/BANDAGES/DRESSINGS) ×2
DERMABOND ADVANCED .7 DNX12 (GAUZE/BANDAGES/DRESSINGS) ×1 IMPLANT
DRAPE C-ARM 42X120 X-RAY (DRAPES) ×3 IMPLANT
DRAPE STERI IOBAN 125X83 (DRAPES) ×3 IMPLANT
DRAPE U-SHAPE 47X51 STRL (DRAPES) ×9 IMPLANT
DRSG AQUACEL AG ADV 3.5X10 (GAUZE/BANDAGES/DRESSINGS) ×3 IMPLANT
DRSG TEGADERM 4X4.75 (GAUZE/BANDAGES/DRESSINGS) IMPLANT
DURAPREP 26ML APPLICATOR (WOUND CARE) ×3 IMPLANT
ELECT BLADE TIP CTD 4 INCH (ELECTRODE) ×3 IMPLANT
ELECT REM PT RETURN 9FT ADLT (ELECTROSURGICAL) ×3
ELECTRODE REM PT RTRN 9FT ADLT (ELECTROSURGICAL) ×1 IMPLANT
EVACUATOR 1/8 PVC DRAIN (DRAIN) IMPLANT
FACESHIELD LNG OPTICON STERILE (SAFETY) ×12 IMPLANT
GAUZE SPONGE 2X2 8PLY STRL LF (GAUZE/BANDAGES/DRESSINGS) IMPLANT
GLOVE BIOGEL PI IND STRL 7.5 (GLOVE) ×1 IMPLANT
GLOVE BIOGEL PI IND STRL 8 (GLOVE) ×1 IMPLANT
GLOVE BIOGEL PI INDICATOR 7.5 (GLOVE) ×2
GLOVE BIOGEL PI INDICATOR 8 (GLOVE) ×2
GLOVE ECLIPSE 8.0 STRL XLNG CF (GLOVE) ×3 IMPLANT
GLOVE ORTHO TXT STRL SZ7.5 (GLOVE) ×6 IMPLANT
GOWN SPEC L3 XXLG W/TWL (GOWN DISPOSABLE) ×3 IMPLANT
GOWN STRL REUS W/TWL LRG LVL3 (GOWN DISPOSABLE) ×3 IMPLANT
KIT BASIN OR (CUSTOM PROCEDURE TRAY) ×3 IMPLANT
PACK TOTAL JOINT (CUSTOM PROCEDURE TRAY) ×3 IMPLANT
PADDING CAST COTTON 6X4 STRL (CAST SUPPLIES) ×3 IMPLANT
SPONGE GAUZE 2X2 STER 10/PKG (GAUZE/BANDAGES/DRESSINGS)
SUT MNCRL AB 4-0 PS2 18 (SUTURE) ×3 IMPLANT
SUT VIC AB 1 CT1 36 (SUTURE) ×9 IMPLANT
SUT VIC AB 2-0 CT1 27 (SUTURE) ×4
SUT VIC AB 2-0 CT1 TAPERPNT 27 (SUTURE) ×2 IMPLANT
SUT VLOC 180 0 24IN GS25 (SUTURE) ×3 IMPLANT
TOWEL OR 17X26 10 PK STRL BLUE (TOWEL DISPOSABLE) ×3 IMPLANT
TOWEL OR NON WOVEN STRL DISP B (DISPOSABLE) IMPLANT
TRAY FOLEY CATH 14FRSI W/METER (CATHETERS) ×3 IMPLANT

## 2013-08-25 NOTE — Preoperative (Signed)
Beta Blockers   Reason not to administer Beta Blockers:Not Applicable 

## 2013-08-25 NOTE — Anesthesia Postprocedure Evaluation (Signed)
Anesthesia Post Note  Patient: Taylor [name]  Procedure(s) Performed: Procedure(s) (LRB): LEFT TOTAL HIP ARTHROPLASTY ANTERIOR APPROACH (Left)  Anesthesia type: Spinal  Patient location: PACU  Post pain: Pain level controlled  Post assessment: Post-op Vital signs reviewed  Last Vitals: BP 109/80  Pulse 77  Temp(Src) 36.7 C (Oral)  Resp 18  SpO2 100%  Post vital signs: Reviewed  Level of consciousness: sedated  Complications: No apparent anesthesia complications

## 2013-08-25 NOTE — Anesthesia Procedure Notes (Signed)
Spinal  Patient location during procedure: OR Start time: 08/25/2013 4:27 PM End time: 08/25/2013 4:30 PM Staffing Anesthesiologist: Nolon Nations R Performed by: anesthesiologist  Preanesthetic Checklist Completed: patient identified, site marked, surgical consent, pre-op evaluation, timeout performed, IV checked, risks and benefits discussed and monitors and equipment checked Spinal Block Patient position: sitting Prep: ChloraPrep Patient monitoring: heart rate, continuous pulse ox and blood pressure Approach: midline Location: L2-3 Injection technique: single-shot Needle Needle type: Sprotte  Needle gauge: 24 G Needle length: 9 cm Assessment Sensory level: T8 Additional Notes Expiration date of kit checked and confirmed. Patient tolerated procedure well, without complications.

## 2013-08-25 NOTE — Transfer of Care (Signed)
Immediate Anesthesia Transfer of Care Note  Patient: Taylor [name]  Procedure(s) Performed: Procedure(s): LEFT TOTAL HIP ARTHROPLASTY ANTERIOR APPROACH (Left)  Patient Location: PACU  Anesthesia Type:Spinal  Level of Consciousness: awake, alert  and oriented  Airway & Oxygen Therapy: Patient Spontanous Breathing and Patient connected to nasal cannula oxygen  Post-op Assessment: Report given to PACU RN and Post -op Vital signs reviewed and stable  Post vital signs: Reviewed and stable  Complications: No apparent anesthesia complications

## 2013-08-25 NOTE — Op Note (Signed)
NAME:  Taylor [name]                ACCOUNT NO.: [id number]      MEDICAL RECORD NO.: [id number]      [facility]      PHYSICIAN:  Durene RomansLIN,Zakia Sainato D  DATE OF BIRTH:  12/24/1960     DATE OF PROCEDURE:  08/25/2013                                 OPERATIVE REPORT         PREOPERATIVE DIAGNOSIS: Left  hip osteoarthritis.      POSTOPERATIVE DIAGNOSIS:  Left hip osteoarthritis.      PROCEDURE:  Left total hip replacement through an anterior approach   utilizing DePuy THR system, component size 52mm pinnacle cup, a size 36+4 neutral   Altrex liner, a size 4 Hi Tri Lock stem with a 36+1.5 delta ceramic   ball.      SURGEON:  Madlyn FrankelMatthew D. Charlann Boxerlin, M.D.      ASSISTANT:  Lanney GinsMatthew Babish, PA-C     ANESTHESIA:  Spinal.      SPECIMENS:  None.      COMPLICATIONS:  None.      BLOOD LOSS:  800 cc     DRAINS:  None.      INDICATION OF THE PROCEDURE:  Taylor [name] is a 53 y.o. female who had   presented to office for evaluation of left hip pain.  Radiographs revealed   progressive degenerative changes with bone-on-bone   articulation to the  hip joint.  The patient had painful limited range of   motion significantly affecting their overall quality of life.  The patient was failing to    respond to conservative measures, and at this point was ready   to proceed with more definitive measures.  The patient has noted progressive   degenerative changes in his hip, progressive problems and dysfunction   with regarding the hip prior to surgery.  Consent was obtained for   benefit of pain relief.  Specific risk of infection, DVT, component   failure, dislocation, need for revision surgery, as well discussion of   the anterior versus posterior approach were reviewed.  Consent was   obtained for benefit of anterior pain relief through an anterior   approach.      PROCEDURE IN DETAIL:  The patient was brought to operative theater.   Once adequate anesthesia, preoperative  antibiotics, 2gm Ancef administered.   The patient was positioned supine on the OSI Hanna table.  Once adequate   padding of boney process was carried out, we had predraped out the hip, and  used fluoroscopy to confirm orientation of the pelvis and position.      The left hip was then prepped and draped from proximal iliac crest to   mid thigh with shower curtain technique.      Time-out was performed identifying the patient, planned procedure, and   extremity.     An incision was then made 2 cm distal and lateral to the   anterior superior iliac spine extending over the orientation of the   tensor fascia lata muscle and sharp dissection was carried down to the   fascia of the muscle and protractor placed in the soft tissues.      The fascia was then incised.  The muscle belly was identified and swept   laterally  and retractor placed along the superior neck.  Following   cauterization of the circumflex vessels and removing some pericapsular   fat, a second cobra retractor was placed on the inferior neck.  A third   retractor was placed on the anterior acetabulum after elevating the   anterior rectus.  A L-capsulotomy was along the line of the   superior neck to the trochanteric fossa, then extended proximally and   distally.  Tag sutures were placed and the retractors were then placed   intracapsular.  We then identified the trochanteric fossa and   orientation of my neck cut, confirmed this radiographically   and then made a neck osteotomy with the femur on traction.  The femoral   head was removed without difficulty or complication.  Traction was let   off and retractors were placed posterior and anterior around the   acetabulum.      The labrum and foveal tissue were debrided.  I began reaming with a 47mm   reamer and reamed up to 51mm reamer with good bony bed preparation and a 52   cup was chosen.  The final 52mm Pinnacle cup was then impacted under fluoroscopy  to confirm the  depth of penetration and orientation with respect to   abduction.  A screw was placed followed by the hole eliminator.  The final   36+4 neutral Altrex liner was impacted with good visualized rim fit.  The cup was positioned anatomically within the acetabular portion of the pelvis.      At this point, the femur was rolled at 80 degrees.  Further capsule was   released off the inferior aspect of the femoral neck.  I then   released the superior capsule proximally.  The hook was placed laterally   along the femur and elevated manually and held in position with the bed   hook.  The leg was then extended and adducted with the leg rolled to 100   degrees of external rotation.  Once the proximal femur was fully   exposed, I used a box osteotome to set orientation.  I then began   broaching with the starting chili pepper broach and passed this by hand and then broached up to 4.  With the 4 broach in place I chose a high offset neck and did a trial reduction.  The offset was appropriate, leg lengths   appeared to be equal, confirmed radiographically.   Given these findings, I went ahead and dislocated the hip, repositioned all   retractors and positioned the right hip in the extended and abducted position.  The final 4 Hi Tri Lock stem was   chosen and it was impacted down to the level of neck cut.  Based on this   and the trial reduction, a 36+1.5 delta ceramic ball was chosen and   impacted onto a clean and dry trunnion, and the hip was reduced.  The   hip had been irrigated throughout the case again at this point.  I did   reapproximate the superior capsular leaflet to the anterior leaflet   using #1 Vicryl.  The fascia of the   tensor fascia lata muscle was then reapproximated using #1 Vicryl and #0 V-lock sutures.  The   remaining wound was closed with 2-0 Vicryl and running 4-0 Monocryl.   The hip was cleaned, dried, and dressed sterilely using Dermabond and   Aquacel dressing.  She was then  brought   to recovery room in  stable condition tolerating the procedure well.   Shelly Coss was present for the entirety of the case involved from   preoperative positioning, perioperative retractor management, general   facilitation of the case, as well as primary wound closure as assistant.            Madlyn Frankel Charlann Boxer, M.D.        08/25/2013 5:46 PM

## 2013-08-25 NOTE — Interval H&P Note (Signed)
History and Physical Interval Note:  08/25/2013 3:36 PM  Taylor [name]  has presented today for surgery, with the diagnosis of LEF HIP OA  The various methods of treatment have been discussed with the patient and family. After consideration of risks, benefits and other options for treatment, the patient has consented to  Procedure(s): LEFT TOTAL HIP ARTHROPLASTY ANTERIOR APPROACH (Left) as a surgical intervention .  The patient's history has been reviewed, patient examined, no change in status, stable for surgery.  I have reviewed the patient's chart and labs.  Questions were answered to the patient's satisfaction.     Shelda PalLIN,[name]

## 2013-08-25 NOTE — Anesthesia Preprocedure Evaluation (Addendum)
Anesthesia Evaluation  Patient identified by MRN, date of birth, ID band Patient awake    Reviewed: Allergy & Precautions, H&P , NPO status , Patient's Chart, lab work & pertinent test results  History of Anesthesia Complications (+) PONV and history of anesthetic complications  Airway Mallampati: II TM Distance: >3 FB Neck ROM: Full    Dental  (+) Dental Advisory Given   Pulmonary neg pulmonary ROS,  breath sounds clear to auscultation        Cardiovascular hypertension, Pt. on medications Rhythm:Regular Rate:Normal     Neuro/Psych negative neurological ROS  negative psych ROS   GI/Hepatic negative GI ROS, Neg liver ROS,   Endo/Other  negative endocrine ROS  Renal/GU negative Renal ROS     Musculoskeletal negative musculoskeletal ROS (+)   Abdominal   Peds  Hematology negative hematology ROS (+)   Anesthesia Other Findings   Reproductive/Obstetrics negative OB ROS                          Anesthesia Physical Anesthesia Plan  ASA: II  Anesthesia Plan: Spinal   Post-op Pain Management:    Induction:   Airway Management Planned:   Additional Equipment:   Intra-op Plan:   Post-operative Plan:   Informed Consent: I have reviewed the patients History and Physical, chart, labs and discussed the procedure including the risks, benefits and alternatives for the proposed anesthesia with the patient or authorized representative who has indicated his/her understanding and acceptance.   Dental advisory given  Plan Discussed with: CRNA  Anesthesia Plan Comments:         Anesthesia Quick Evaluation

## 2013-08-26 ENCOUNTER — Encounter (HOSPITAL_COMMUNITY): Payer: Self-pay | Admitting: Orthopedic Surgery

## 2013-08-26 DIAGNOSIS — E669 Obesity, unspecified: Secondary | ICD-10-CM | POA: Diagnosis present

## 2013-08-26 DIAGNOSIS — D500 Iron deficiency anemia secondary to blood loss (chronic): Secondary | ICD-10-CM | POA: Diagnosis not present

## 2013-08-26 LAB — BASIC METABOLIC PANEL
BUN: 14 mg/dL (ref 6–23)
CO2: 23 mEq/L (ref 19–32)
Calcium: 8.3 mg/dL — ABNORMAL LOW (ref 8.4–10.5)
Chloride: 95 mEq/L — ABNORMAL LOW (ref 96–112)
Creatinine, Ser: 0.71 mg/dL (ref 0.50–1.10)
Glucose, Bld: 221 mg/dL — ABNORMAL HIGH (ref 70–99)
Potassium: 3.6 mEq/L — ABNORMAL LOW (ref 3.7–5.3)
SODIUM: 132 meq/L — AB (ref 137–147)

## 2013-08-26 LAB — CBC
HEMATOCRIT: 34.5 % — AB (ref 36.0–46.0)
Hemoglobin: 11.6 g/dL — ABNORMAL LOW (ref 12.0–15.0)
MCH: 29.3 pg (ref 26.0–34.0)
MCHC: 33.6 g/dL (ref 30.0–36.0)
MCV: 87.1 fL (ref 78.0–100.0)
Platelets: 322 10*3/uL (ref 150–400)
RBC: 3.96 MIL/uL (ref 3.87–5.11)
RDW: 12.6 % (ref 11.5–15.5)
WBC: 19.1 10*3/uL — ABNORMAL HIGH (ref 4.0–10.5)

## 2013-08-26 LAB — URINALYSIS, ROUTINE W REFLEX MICROSCOPIC
Bilirubin Urine: NEGATIVE
Glucose, UA: 500 mg/dL — AB
Ketones, ur: NEGATIVE mg/dL
LEUKOCYTES UA: NEGATIVE
NITRITE: NEGATIVE
PH: 5.5 (ref 5.0–8.0)
Protein, ur: NEGATIVE mg/dL
SPECIFIC GRAVITY, URINE: 1.043 — AB (ref 1.005–1.030)
Urobilinogen, UA: 0.2 mg/dL (ref 0.0–1.0)

## 2013-08-26 LAB — URINE MICROSCOPIC-ADD ON

## 2013-08-26 MED ORDER — OXYCODONE HCL 5 MG PO TABS: 5.0000 mg | ORAL_TABLET | ORAL | Status: DC | PRN

## 2013-08-26 MED ORDER — KETOROLAC TROMETHAMINE 15 MG/ML IJ SOLN
15.0000 mg | Freq: Four times a day (QID) | INTRAMUSCULAR | Status: DC
  Administered 2013-08-26: 15 mg via INTRAVENOUS
  Filled 2013-08-26: qty 1

## 2013-08-26 MED ORDER — METHOCARBAMOL 500 MG PO TABS: 500.0000 mg | ORAL_TABLET | Freq: Four times a day (QID) | ORAL | Status: AC | PRN

## 2013-08-26 MED ORDER — POLYETHYLENE GLYCOL 3350 17 G PO PACK
17.0000 g | PACK | Freq: Two times a day (BID) | ORAL | Status: DC
Start: 2013-08-26 — End: 2016-07-04

## 2013-08-26 MED ORDER — DSS 100 MG PO CAPS: 100.0000 mg | ORAL_CAPSULE | Freq: Two times a day (BID) | ORAL | Status: DC

## 2013-08-26 MED ORDER — ASPIRIN 325 MG PO TBEC: 325.0000 mg | DELAYED_RELEASE_TABLET | Freq: Two times a day (BID) | ORAL | Status: AC

## 2013-08-26 MED ORDER — FERROUS SULFATE 325 (65 FE) MG PO TABS: 325.0000 mg | ORAL_TABLET | Freq: Three times a day (TID) | ORAL | Status: DC

## 2013-08-26 NOTE — Progress Notes (Signed)
[facility]   [facility] providing [facility] services: RW and Commode  If patient discharges after hours, please call [phone].   Renard HamperLecretia Williamson 08/26/2013, 9:46 AM

## 2013-08-26 NOTE — Evaluation (Signed)
Physical Therapy Evaluation Patient Details Name: Taylor [name] MRN: [id number] DOB: 1960/08/18 Today's Date: 08/26/2013 Time: 4782-9562 PT Time Calculation (min): 43 min  PT Assessment / Plan / Recommendation History of Present Illness  pt was admitted for L DA THA  Clinical Impression  Pt s/p L THR presents with decreased L LE strength/ROM and post op pain limiting functional mobility.  Pt should progress well to d/c home with family assist and HHPT follow up    PT Assessment  Patient needs continued PT services    Follow Up Recommendations  Home health PT    Does the patient have the potential to tolerate intense rehabilitation      Barriers to Discharge        Equipment Recommendations  Rolling walker with 5" wheels    Recommendations for Other Services OT consult   Frequency 7X/week    Precautions / Restrictions Precautions Precautions: None Restrictions Weight Bearing Restrictions: No Other Position/Activity Restrictions: WBAT   Pertinent Vitals/Pain 4/10; premed, RN aware      Mobility  Bed Mobility Overal bed mobility: Needs Assistance Bed Mobility: Supine to Sit Supine to sit: Min assist General bed mobility comments: cues for sequence and use of R LE to self assist Transfers Overall transfer level: Needs assistance Equipment used: Rolling walker (2 wheeled) Transfers: Sit to/from Stand Sit to Stand: Supervision General transfer comment: cues for LE position Ambulation/Gait Ambulation/Gait assistance: Min assist Ambulation Distance (Feet): 123 Feet Assistive device: Rolling walker (2 wheeled) Gait Pattern/deviations: Step-to pattern;Decreased step length - right;Decreased step length - left;Shuffle;Trunk flexed Gait velocity: decr General Gait Details: cues for sequence, posture, stride length and position from RW    Exercises Total Joint Exercises Ankle Circles/Pumps: AROM;15 reps;Supine;Both Quad Sets: AROM;Both;10 reps;Supine Heel Slides:  AAROM;20 reps;Supine;Left Hip ABduction/ADduction: AAROM;Left;15 reps;Supine   PT Diagnosis: Difficulty walking  PT Problem List: Decreased strength;Decreased range of motion;Decreased activity tolerance;Decreased mobility;Decreased knowledge of use of DME;Pain PT Treatment Interventions: DME instruction;Stair training;Gait training;Functional mobility training;Therapeutic activities;Therapeutic exercise;Patient/family education     PT Goals(Current goals can be found in the care plan section) Acute Rehab PT Goals Patient Stated Goal: get back to being active and independent PT Goal Formulation: With patient Potential to Achieve Goals: Good  Visit Information  Last PT Received On: 08/26/13 Assistance Needed: +1 History of Present Illness: pt was admitted for L DA THA       Prior Functioning  Home Living Family/patient expects to be discharged to:: Private residence Living Arrangements: Spouse/significant other Available Help at Discharge: Family Type of Home: House Home Access: Stairs to enter Secretary/administrator of Steps: 3 Entrance Stairs-Rails: None Home Layout: Two level Alternate Level Stairs-Number of Steps: 13 Home Equipment: Crutches Additional Comments: 3:1 delivered to room Prior Function Level of Independence: Independent Communication Communication: No difficulties    Cognition  Cognition Arousal/Alertness: Awake/alert Behavior During Therapy: WFL for tasks assessed/performed Overall Cognitive Status: Within Functional Limits for tasks assessed    Extremity/Trunk Assessment Upper Extremity Assessment Upper Extremity Assessment: Overall WFL for tasks assessed Lower Extremity Assessment Lower Extremity Assessment: LLE deficits/detail LLE Deficits / Details: 2/5 hip strength wtih AAROM at hip to 85 flex adn 15 abd   Balance General Comments General comments (skin integrity, edema, etc.): Pt really doesn't have concerns at home.  They will keep dogs on  different floor which she recovers and family will bring them to see her as she is seated with pillows over lap to protect sx site.  Educated on using walker/positioning  to retrieve items.  End of Session PT - End of Session Equipment Utilized During Treatment: Gait belt Activity Tolerance: Patient tolerated treatment well Patient left: in chair;with call bell/phone within reach Nurse Communication: Mobility status  GP     Jarrell Armond 08/26/2013, 12:38 PM

## 2013-08-26 NOTE — Progress Notes (Signed)
UR completed. Hasel Janish RN CCM Case Mgmt phone [phone] 

## 2013-08-26 NOTE — Evaluation (Signed)
Occupational Therapy Evaluation Patient Details Name: Taylor DailyRebecca Chaires MRN: [id number] DOB: 07/28/1960 Today's Date: 08/26/2013 Time: [id number] OT Time Calculation (min): 18 min  OT Assessment / Plan / Recommendation History of present illness pt was admitted for L DA THA   Clinical Impression   Pt was admitted for the above sx. All education was completed.  No further OT is needed at this time.      OT Assessment  Patient does not need any further OT services    Follow Up Recommendations  No OT follow up    Barriers to Discharge      Equipment Recommendations  3 in 1 bedside comode    Recommendations for Other Services    Frequency       Precautions / Restrictions Precautions Precautions: None Restrictions Weight Bearing Restrictions: No   Pertinent Vitals/Pain 3/10/stiff LLE.  repositioned    ADL  Toilet Transfer: Supervision/safety Toilet Transfer Method: Sit to stand Tub/Shower Transfer: Supervision/safety Tub/Shower Transfer Method: Science writerAmbulating Tub/Shower Transfer Equipment: Walk in Scientist, research (physical sciences)shower Equipment Used: Agricultural consultantolling walker Transfers/Ambulation Related to ADLs: simulated shower stall as pt has accessible stall in hospital room ADL Comments: Pt can perform UB adls with set up and LB bathing with set up/supervision using long sponge (she has).  Min a is needed for LB dressing.  Pt will have husband to assist and should progress well without the need for AE    OT Diagnosis:    OT Problem List:   OT Treatment Interventions:     OT Goals(Current goals can be found in the care plan section) Acute Rehab OT Goals Patient Stated Goal: get back to being active and independent  Visit Information  Last OT Received On: 08/26/13 Assistance Needed: +1 History of Present Illness: pt was admitted for L DA THA       Prior Functioning     Home Living Family/patient expects to be discharged to:: Private residence Living Arrangements: Spouse/significant other Additional  Comments: 3:1 delivered to room Prior Function Level of Independence: Independent Communication Communication: No difficulties         Vision/Perception     Cognition  Cognition Arousal/Alertness: Awake/alert Behavior During Therapy: WFL for tasks assessed/performed Overall Cognitive Status: Within Functional Limits for tasks assessed    Extremity/Trunk Assessment Upper Extremity Assessment Upper Extremity Assessment: Overall WFL for tasks assessed     Mobility Transfers Overall transfer level: Needs assistance Equipment used: Rolling walker (2 wheeled) Transfers: Sit to/from Stand Sit to Stand: Supervision General transfer comment: cues for LE position     Exercise     Balance General Comments General comments (skin integrity, edema, etc.): Pt really doesn't have concerns at home.  They will keep dogs on different floor which she recovers and family will bring them to see her as she is seated with pillows over lap to protect sx site.  Educated on using walker/positioning to retrieve items.   End of Session OT - End of Session Activity Tolerance: Patient tolerated treatment well Patient left: in chair;with call bell/phone within reach  GO     [facility] HospitalENCER,Dalia Jollie 08/26/2013, 12:33 PM Marica OtterMaryellen Shellie Goettl, OTR/L [phone] 08/26/2013

## 2013-08-26 NOTE — Progress Notes (Signed)
   CARE MANAGEMENT NOTE 08/26/2013  Patient:  Taylor [name],Taylor   Account Number:  [id number]  Date Initiated:  08/26/2013  Documentation initiated by:  [facility],Kameshia Madruga  Subjective/Objective Assessment:   LEFT TOTAL HIP ARTHROPLASTY ANTERIOR APPROACH     Action/Plan:   HH   Anticipated DC Date:  08/28/2013   Anticipated DC Plan:  HOME W HOME HEALTH SERVICES      DC Planning Services  CM consult      [facility]   Choice offered to / List presented to:  C-1 Patient   DME arranged  3-N-1  Levan HurstWALKER - [facility]      DME agency  [facility]     HH arranged  HH-2 PT      [facility]   Status of service:  Completed, signed off Medicare Important Message given?   (If response is "NO", the following Medicare IM given date fields will be blank) Date Medicare IM given:   Date Additional Medicare IM given:    Discharge Disposition:  HOME W HOME HEALTH SERVICES  Per UR Regulation:    If discussed at Long Length of Stay Meetings, dates discussed:    Comments:  08/26/2013 1207 NCM spoke to pt and [facility] States RW and 3n1 needed for home. Contacted [facility] for DME. [facility] aware of HH needed. Isidoro DonningAlesia Naphtali Zywicki RN CCM Case Mgmt phone [phone]

## 2013-08-26 NOTE — Progress Notes (Signed)
Physical Therapy Treatment Patient Details Name: Taylor [name] MRN: [id number] DOB: 09/25/1960 Today's Date: 08/26/2013 Time: [id number] PT Time Calculation (min): 26 min  PT Assessment / Plan / Recommendation  History of Present Illness pt was admitted for L DA THA   PT Comments   Reviewed car transfers and stairs with pt.  Follow Up Recommendations  Home health PT     Does the patient have the potential to tolerate intense rehabilitation     Barriers to Discharge        Equipment Recommendations  Rolling walker with 5" wheels    Recommendations for Other Services OT consult  Frequency 7X/week   Progress towards PT Goals Progress towards PT goals: Progressing toward goals  Plan Current plan remains appropriate    Precautions / Restrictions Precautions Precautions: None Restrictions Weight Bearing Restrictions: No Other Position/Activity Restrictions: WBAT   Pertinent Vitals/Pain 3/10; premed    Mobility  Bed Mobility Overal bed mobility: Needs Assistance Bed Mobility: Sit to Supine Sit to supine: Min guard General bed mobility comments: cues for sequence and use of R LE to self assist; educated pt on use of sheet to self assist Transfers Overall transfer level: Needs assistance Equipment used: Rolling walker (2 wheeled) Transfers: Sit to/from Stand Sit to Stand: Supervision General transfer comment: cues for use of UEs to self assist Ambulation/Gait Ambulation/Gait assistance: Min guard;Supervision Ambulation Distance (Feet): 200 Feet (and 100) Assistive device: Rolling walker (2 wheeled) Gait Pattern/deviations: Step-to pattern;Shuffle;Trunk flexed General Gait Details: cues for sequence, posture, stride length and position from RW Stairs: Yes Stairs assistance: Min assist Stair Management: No rails;One rail Right;Forwards;With crutches;Step to pattern Number of Stairs: 8 General stair comments: 4 steps with crutch and rail; 4 steps with bil crutches; cues  for sequence and foot/crutch placement    Exercises     PT Diagnosis:    PT Problem List:   PT Treatment Interventions:     PT Goals (current goals can now be found in the care plan section) Acute Rehab PT Goals Patient Stated Goal: get back to being active and independent PT Goal Formulation: With patient Potential to Achieve Goals: Good  Visit Information  Last PT Received On: 08/26/13 Assistance Needed: +1 History of Present Illness: pt was admitted for L DA THA    Subjective Data  Patient Stated Goal: get back to being active and independent   Cognition  Cognition Arousal/Alertness: Awake/alert Behavior During Therapy: WFL for tasks assessed/performed Overall Cognitive Status: Within Functional Limits for tasks assessed    Balance  General Comments General comments (skin integrity, edema, etc.): Pt really doesn't have concerns at home.  They will keep dogs on different floor which she recovers and family will bring them to see her as she is seated with pillows over lap to protect sx site.  Educated on using walker/positioning to retrieve items.  End of Session PT - End of Session Equipment Utilized During Treatment: Gait belt Activity Tolerance: Patient tolerated treatment well Patient left: with call bell/phone within reach;in bed Nurse Communication: Mobility status   GP     Taylor [name] 08/26/2013, 3:15 PM

## 2013-08-26 NOTE — Progress Notes (Signed)
   Subjective: 1 Day Post-Op Procedure(s) (LRB): LEFT TOTAL HIP ARTHROPLASTY ANTERIOR APPROACH (Left)   Patient reports pain as moderate. No events throughout the night. Ready to be discharged home if she does well with PT and pain controlled.   Objective:   VITALS:   Filed Vitals:   08/26/13 0513  BP: 113/80  Pulse: 89  Temp: 98 F (36.7 C)  Resp: 16    Neurovascular intact Dorsiflexion/Plantar flexion intact Incision: dressing C/D/I No cellulitis present Compartment soft  LABS  Recent Labs  08/26/13 0420  HGB 11.6*  HCT 34.5*  WBC 19.1*  PLT 322     Recent Labs  08/26/13 0420  NA 132*  K 3.6*  BUN 14  CREATININE 0.71  GLUCOSE 221*     Assessment/Plan: 1 Day Post-Op Procedure(s) (LRB): LEFT TOTAL HIP ARTHROPLASTY ANTERIOR APPROACH (Left) Foley cath d/c'ed Advance diet Up with therapy D/C IV fluids Discharge home with home health [facility] Follow up with OLIN,Reagen Goates D in 2 weeks.  Contact information:  [facility] [address] [place] [id number] [phone]    Expected ABLA  Treated with iron and will observe  Obese (BMI 30-39.9) Estimated body mass index is 30.53 kg/(m^2) as calculated from the following:   Height as of this encounter: 5\' 7"  (1.702 m).   Weight as of this encounter: 88.451 kg (195 lb). Patient also counseled that weight may inhibit the healing process Patient counseled that losing weight will help with future health issues      Anastasio AuerbachMatthew S. Jaylanni Eltringham   PAC  08/26/2013, 9:25 AM

## 2013-08-27 LAB — URINE CULTURE
Colony Count: NO GROWTH
Culture: NO GROWTH
Special Requests: NORMAL

## 2013-08-28 NOTE — Discharge Summary (Signed)
Physician Discharge Summary  Patient ID: Taylor DailyRebecca Barretto MRN: [id number] DOB/AGE: 53/01/1961 53 y.o.  Admit date: 08/25/2013 Discharge date: 08/26/2013   Procedures:  Procedure(s) (LRB): LEFT TOTAL HIP ARTHROPLASTY ANTERIOR APPROACH (Left)  Attending Physician:  Dr. Durene RomansMatthew Olin   Admission Diagnoses:   Left hip OA / pain  Discharge Diagnoses:  Principal Problem:   S/P left THA, AA Active Problems:   Expected blood loss anemia   Obese  Past Medical History  Diagnosis Date  . Complication of anesthesia   . PONV (postoperative nausea and vomiting)   . Hypertension   . Arthritis   . Eczema   . ADD (attention deficit disorder)   . Depression     HPI: Taylor DailyRebecca Havener, 53 y.o. female, has a history of pain and functional disability in the left hip(s) due to arthritis and patient has failed non-surgical conservative treatments for greater than 12 weeks to include NSAID's and/or analgesics, corticosteriod injections and activity modification. Onset of symptoms was gradual starting 2+ years ago with gradually worsening course since that time.The patient noted no past surgery on the left hip(s). Patient currently rates pain in the left hip at 7 out of 10 with activity. Patient has night pain, worsening of pain with activity and weight bearing, trendelenberg gait, pain that interfers with activities of [name] living and pain with passive range of motion. Patient has evidence of periarticular osteophytes and joint space narrowing by imaging studies. This condition presents safety issues increasing the risk of falls. There is no current active infection. Risks, benefits and expectations were discussed with the patient. Risks including but not limited to the risk of anesthesia, blood clots, nerve damage, blood vessel damage, failure of the prosthesis, infection and up to and including death. Patient understand the risks, benefits and expectations and wishes to proceed with surgery.  PCP:  Ethel RanaHEPLER,MARK, PA-C   Discharged Condition: good  Hospital Course:  Patient underwent the above stated procedure on 08/25/2013. Patient tolerated the procedure well and brought to the recovery room in good condition and subsequently to the floor.  POD #1 BP: 113/80 ; Pulse: 89 ; Temp: 98 F (36.7 C) ; Resp: 16  Patient reports pain as moderate. No events throughout the night. Ready to be discharged home.  Neurovascular intact, dorsiflexion/plantar flexion intact, incision: dressing C/D/I, no cellulitis present and compartment soft.   LABS  Basename    HGB  11.6  HCT  34.5    Discharge Exam: General appearance: alert, cooperative and no distress Extremities: Homans sign is negative, no sign of DVT, no edema, redness or tenderness in the calves or thighs and no ulcers, gangrene or trophic changes  Disposition:   Home-Health Care Svc with follow up in 2 weeks   Follow-up Information   Follow up with Shelda PalLIN,Jalina Blowers D, MD. Schedule an appointment as soon as possible for a visit in 2 weeks.   Specialty:  Orthopedic Surgery   Contact information:   [address] [place] [id number] [phone]       [facility](Home Health Physical Therapy)    Contact information:   [address] [place] [address] [phone]       Discharge Orders   Future Orders Complete By Expires   Call MD / Call 911  As directed    Comments:     If you experience chest pain or shortness of breath, CALL 911 and be transported to the hospital emergency room.  If you develope a fever above 101 F, pus (white drainage) or increased drainage or redness at the wound, or calf pain, call your surgeon's office.   Change dressing  As directed    Comments:     Maintain surgical dressing for 10-14 days, or until follow up in the clinic.   Constipation Prevention  As directed    Comments:     Drink plenty of fluids.  Prune juice may be helpful.  You may use a  stool softener, such as Colace (over the counter) 100 mg twice a day.  Use MiraLax (over the counter) for constipation as needed.   Diet - low sodium heart healthy  As directed    Discharge instructions  As directed    Comments:     Maintain surgical dressing for 10-14 days, or until follow up in the clinic. Follow up in 2 weeks at [facility]. Call with any questions or concerns.   Increase activity slowly as tolerated  As directed    TED hose  As directed    Comments:     Use stockings (TED hose) for 2 weeks on both leg(s).  You may remove them at night for sleeping.   Weight bearing as tolerated  As directed    Questions:     Laterality:     Extremity:          Medication List         amphetamine-dextroamphetamine 20 MG tablet  Commonly known as:  ADDERALL  Take 20 mg by mouth 2 (two) times [name].     aspirin 325 MG EC tablet  Take 1 tablet (325 mg total) by mouth 2 (two) times [name].     buPROPion 150 MG 24 hr tablet  Commonly known as:  WELLBUTRIN XL  Take 150 mg by mouth every morning.     DSS 100 MG Caps  Take 100 mg by mouth 2 (two) times [name].     ferrous sulfate 325 (65 FE) MG tablet  Take 1 tablet (325 mg total) by mouth 3 (three) times [name] after meals.     hydrochlorothiazide 25 MG tablet  Commonly known as:  HYDRODIURIL  Take 25 mg by mouth every morning.     methocarbamol 500 MG tablet  Commonly known as:  ROBAXIN  Take 1 tablet (500 mg total) by mouth every 6 (six) hours as needed for muscle spasms.     olmesartan 20 MG tablet  Commonly known as:  BENICAR  Take 20 mg by mouth every morning.     oxyCODONE 5 MG immediate release tablet  Commonly known as:  Oxy IR/ROXICODONE  Take 1-3 tablets (5-15 mg total) by mouth every 4 (four) hours as needed for severe pain.     polyethylene glycol packet  Commonly known as:  MIRALAX / GLYCOLAX  Take 17 g by mouth 2 (two) times [name].     sertraline 50 MG tablet  Commonly known as:  ZOLOFT    Take 25 mg by mouth every morning.         Signed: Anastasio Auerbach. Janaysia Mcleroy   PAC  08/28/2013, 10:11 AM

## 2014-04-02 ENCOUNTER — Other Ambulatory Visit: Payer: Self-pay | Admitting: Physician Assistant

## 2014-04-02 DIAGNOSIS — Z1231 Encounter for screening mammogram for malignant neoplasm of breast: Secondary | ICD-10-CM

## 2014-04-22 ENCOUNTER — Ambulatory Visit
Admission: RE | Admit: 2014-04-22 | Discharge: 2014-04-22 | Disposition: A | Discharge status: Home / Self Care | Payer: 59 | Source: Ambulatory Visit | Attending: Physician Assistant | Admitting: Physician Assistant

## 2014-04-22 DIAGNOSIS — Z1231 Encounter for screening mammogram for malignant neoplasm of breast: Secondary | ICD-10-CM

## 2015-02-02 IMAGING — CR DG PORTABLE PELVIS
1 series · 1 of 1 positions shown · non-contrast
Comparison: 08/25/2013

CLINICAL DATA: Left hip replacement

EXAM:
PORTABLE PELVIS 1-2 VIEWS

[AP]
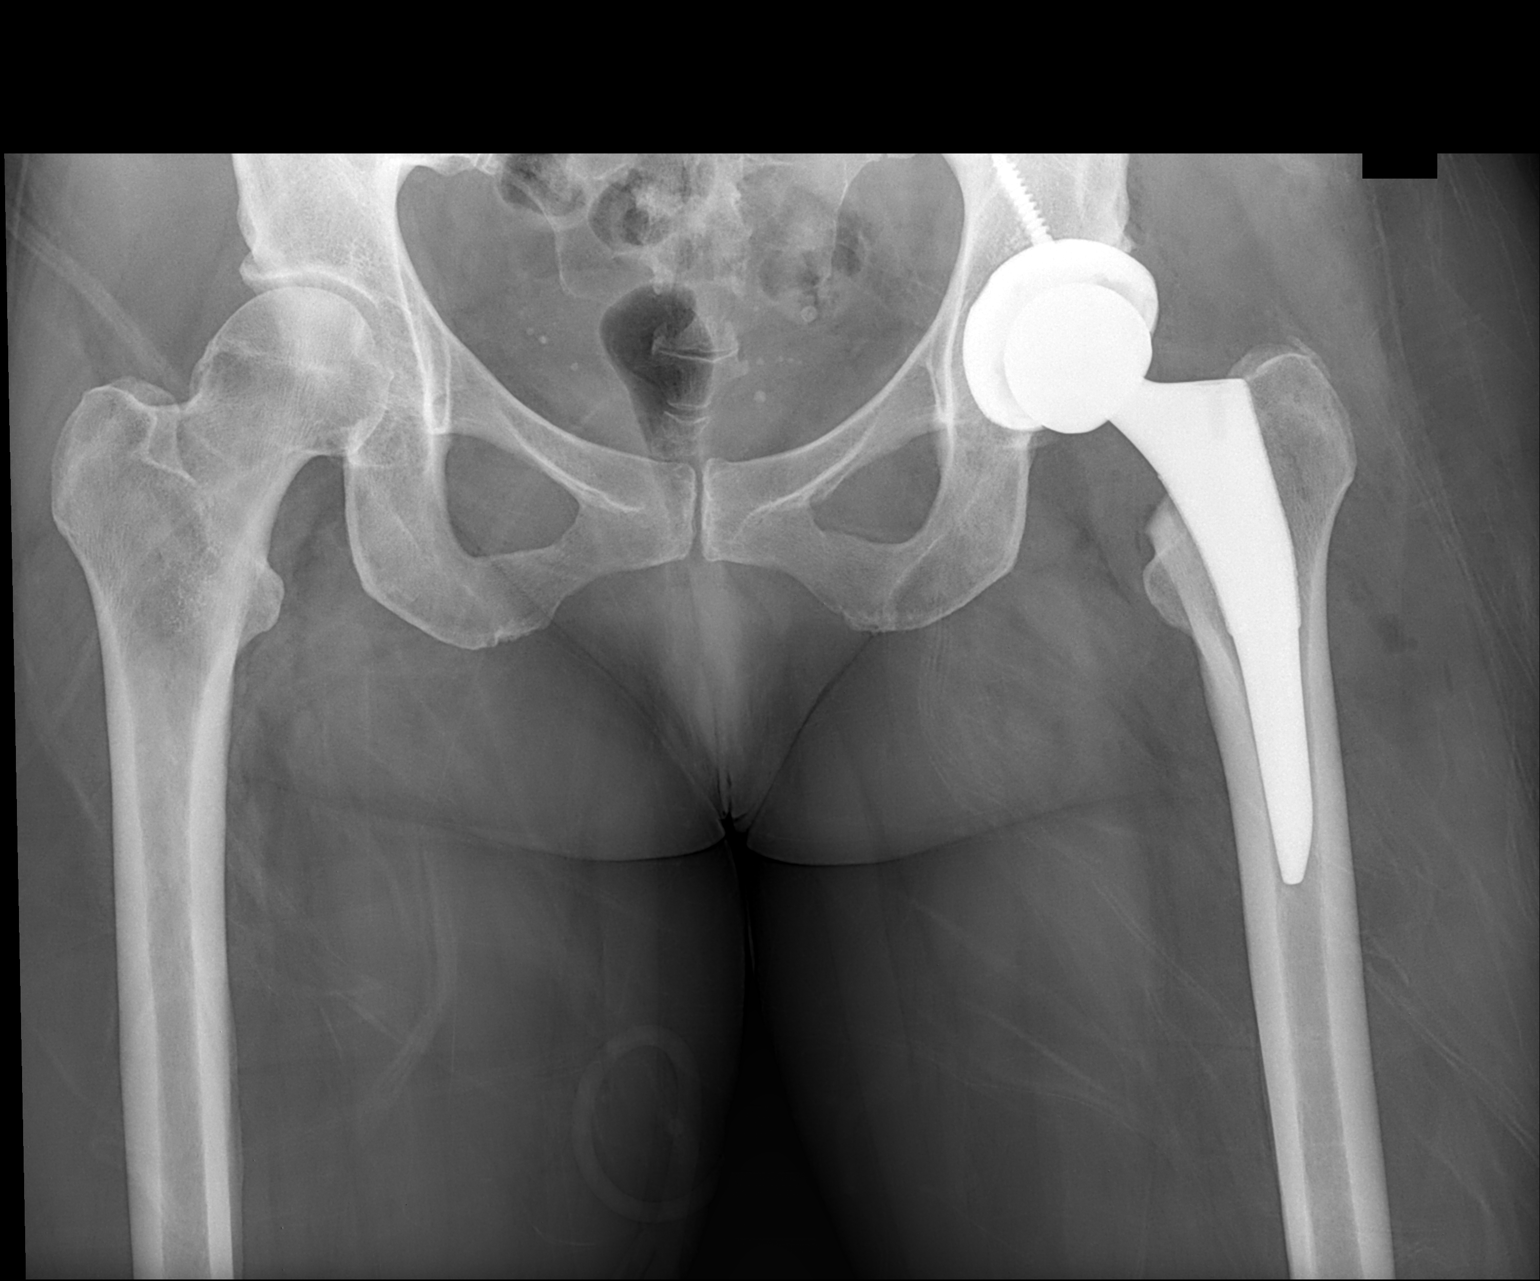

[1 of 1 positions shown; findings below may reference images not displayed]

FINDINGS: Postop changes from left hip replacement. Components appear aligned
in the frontal plane. No hardware or osseous abnormality. Visualized
pelvis intact. Right hip unremarkable.
IMPRESSION: Status post left hip replacement.  No complicating feature.

## 2015-02-02 IMAGING — CR DG HIP 1V PORT*L*
1 series · 1 of 1 positions shown · non-contrast
Comparison: 08/25/2013

CLINICAL DATA: Left hip replacement

EXAM:
PORTABLE LEFT HIP - 1 VIEW

[AP]
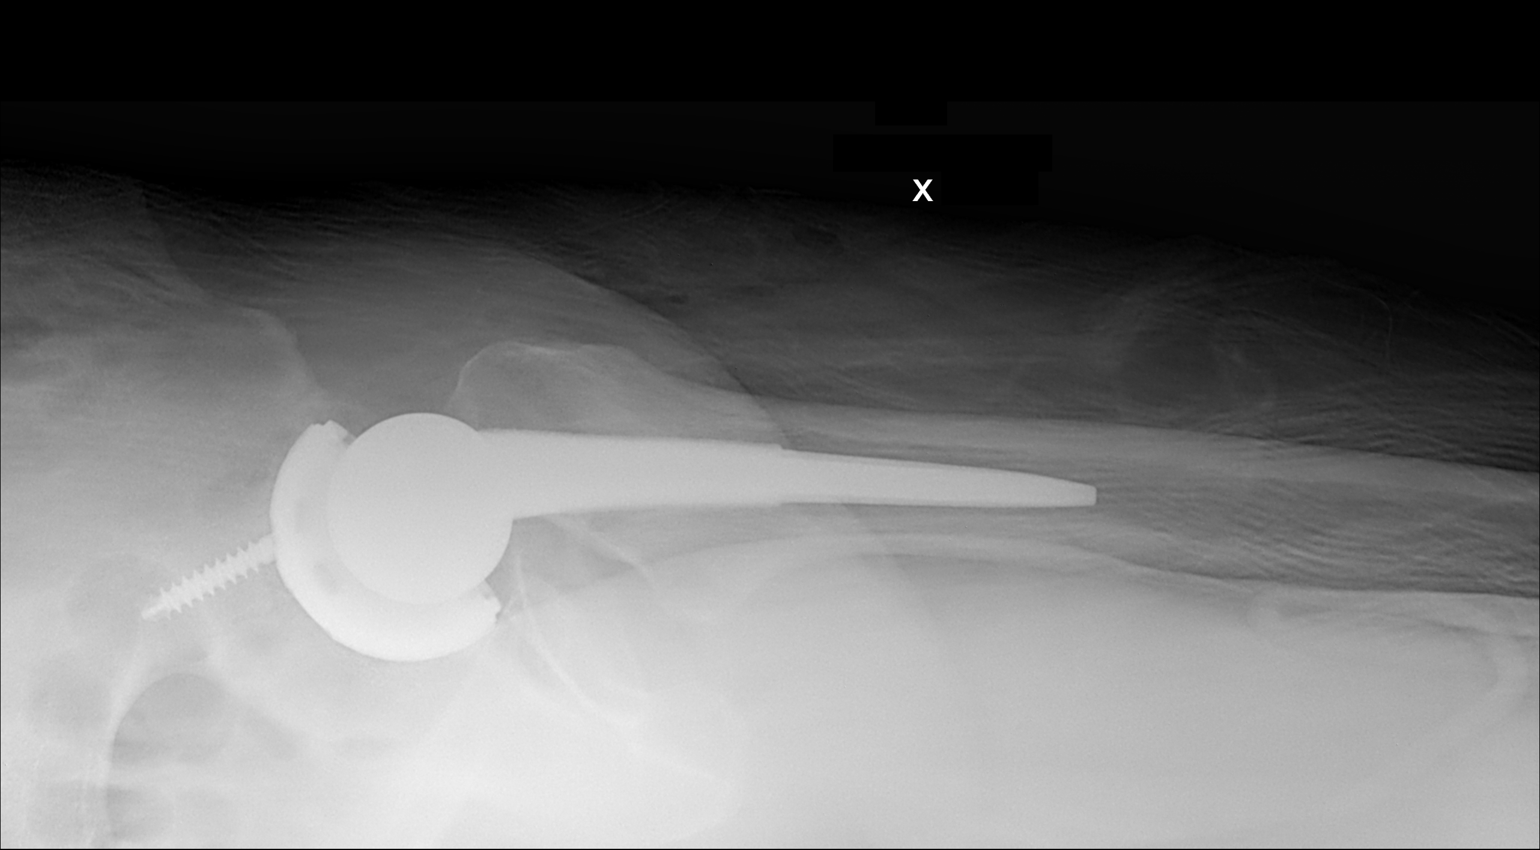

[1 of 1 positions shown; findings below may reference images not displayed]

FINDINGS: The patient is status post left hip replacement. Components appear
aligned in the lateral projection. No definite osseous or hardware
abnormality.
IMPRESSION: Status post left hip replacement.  No complicating feature.

## 2016-06-09 NOTE — H&P (Signed)
TOTAL KNEE ADMISSION H&P  Patient is being admitted for bilateral total knee arthroplasties  Subjective:  Chief Complaint:    Bilateral knee primary OA / pain  HPI: Taylor [name], 55 y.o. female, has a history of pain and functional disability in the bilateral knees due to arthritis and has failed non-surgical conservative treatments for greater than 12 weeks to include NSAID's and/or analgesics, corticosteriod injections, viscosupplementation injections, use of assistive devices and activity modification.  Onset of symptoms was gradual, starting >10 years ago with gradually worsening course since that time. The patient noted prior procedures on the knee to include  arthroscopy and menisectomy on the right knee.  Patient currently rates pain in the bilateral knees at 8 out of 10 with activity. Patient has night pain, worsening of pain with activity and weight bearing, pain that interferes with activities of daily living, pain with passive range of motion, crepitus and joint swelling.  Patient has evidence of periarticular osteophytes and joint space narrowing by imaging studies.  There is no active infection.  Risks, benefits and expectations were discussed with the patient.  Risks including but not limited to the risk of anesthesia, blood clots, nerve damage, blood vessel damage, failure of the prosthesis, infection and up to and including death.  Patient understand the risks, benefits and expectations and wishes to proceed with surgery.   PCP: Lovenia KimHEPLER,MARK, PA-C  D/C Plans:      Home with HHPT  Post-op Meds:       No Rx given  Tranexamic Acid:      To be given - IV   Decadron:      Is to be given  FYI:     Xarelto then ASA  Oxycodone    Patient Active Problem List   Diagnosis Date Noted  . Expected blood loss anemia 08/26/2013  . Obese 08/26/2013  . S/P left THA, AA 08/25/2013   Past Medical History:  Diagnosis Date  . ADD (attention deficit disorder)   . Arthritis   .  Complication of anesthesia   . Depression   . Eczema   . Hypertension   . PONV (postoperative nausea and vomiting)     Past Surgical History:  Procedure Laterality Date  . ABDOMINAL HYSTERECTOMY    . KNEE ARTHROSCOPY  2003 / 2014   rt  . TOTAL HIP ARTHROPLASTY Left 08/25/2013   Procedure: LEFT TOTAL HIP ARTHROPLASTY ANTERIOR APPROACH;  Surgeon: Shelda PalMatthew D Olin, MD;  Location: [facility];  Service: Orthopedics;  Laterality: Left;    No prescriptions prior to admission.   No Known Allergies   Social History  Substance Use Topics  . Smoking status: Never Smoker  . Smokeless tobacco: Not on file  . Alcohol use Yes     Comment: occasional       Review of Systems  Constitutional: Positive for malaise/fatigue.  HENT: Negative.   Eyes: Negative.   Respiratory: Negative.   Cardiovascular: Negative.   Gastrointestinal: Negative.   Genitourinary: Negative.   Musculoskeletal: Positive for joint pain.  Skin: Negative.   Neurological: Negative.   Endo/Heme/Allergies: Negative.   Psychiatric/Behavioral: Positive for depression. The patient is nervous/anxious.     Objective:  Physical Exam  Constitutional: She is oriented to person, place, and time. She appears well-developed.  HENT:  Head: Normocephalic.  Eyes: Pupils are equal, round, and reactive to light.  Neck: Neck supple. No JVD present. No tracheal deviation present. No thyromegaly present.  Cardiovascular: Normal rate, regular rhythm, normal heart sounds and  intact distal pulses.   Respiratory: Effort normal and breath sounds normal. No respiratory distress. She has no wheezes.  GI: Soft. There is no tenderness. There is no guarding.  Musculoskeletal:       Right knee: She exhibits decreased range of motion, swelling, abnormal alignment and bony tenderness. She exhibits no ecchymosis, no deformity, no laceration and no erythema. Tenderness found.       Left knee: She exhibits decreased range of motion, swelling and bony  tenderness. She exhibits no ecchymosis, no deformity, no laceration and no erythema. Tenderness found.  Lymphadenopathy:    She has no cervical adenopathy.  Neurological: She is alert and oriented to person, place, and time.  Skin: Skin is warm and dry.  Psychiatric: She has a normal mood and affect.     Labs:  Estimated body mass index is 30.54 kg/m as calculated from the following:   Height as of 08/25/13: 5\' 7"  (1.702 m).   Weight as of 08/25/13: 88.5 kg (195 lb).   Imaging Review Plain radiographs demonstrate severe degenerative joint disease of the bilateral knees. The overall alignment is valgus of the right knee. The bone quality appears to be good for age and reported activity level.  Assessment/Plan:  End stage arthritis, bilateral knees   The patient history, physical examination, clinical judgment of the provider and imaging studies are consistent with end stage degenerative joint disease of the bilateral knees and total knee arthroplasties are deemed medically necessary. The treatment options including medical management, injection therapy arthroscopy and arthroplasty were discussed at length. The risks and benefits of total knee arthroplasty were presented and reviewed. The risks due to aseptic loosening, infection, stiffness, patella tracking problems, thromboembolic complications and other imponderables were discussed. The patient acknowledged the explanation, agreed to proceed with the plan and consent was signed. Patient is being admitted for inpatient treatment for surgery, pain control, PT, OT, prophylactic antibiotics, VTE prophylaxis, progressive ambulation and ADL's and discharge planning. The patient is planning to be discharged home.      Anastasio AuerbachMatthew S. Haruye Lainez   PA-C  06/09/2016, 8:37 PM

## 2016-06-22 NOTE — Patient Instructions (Addendum)
Taylor [name]  06/22/2016   Your procedure is scheduled on: 07-02-16  Report to [facility] Main  Entrance take [facility]  elevators to 3rd floor to  Short Stay Center at  0615 AM.  Call this number if you have problems the morning of surgery [phone]   Remember: ONLY 1 PERSON MAY GO WITH YOU TO SHORT STAY TO GET  READY MORNING OF YOUR SURGERY.  Do not eat food or drink liquids :After Midnight.     Take these medicines the morning of surgery with A SIP OF WATER: Bupropion. Sertraline. DO NOT TAKE ANY DIABETIC MEDICATIONS DAY OF YOUR SURGERY                               You may not have any metal on your body including hair pins and              piercings  Do not wear jewelry, make-up, lotions, powders or perfumes, deodorant             Do not wear nail polish.  Do not shave  48 hours prior to surgery.              Men may shave face and neck.   Do not bring valuables to the hospital. [facility] IS NOT             RESPONSIBLE   FOR VALUABLES.  Contacts, dentures or bridgework may not be worn into surgery.  Leave suitcase in the car. After surgery it may be brought to your room.     Patients discharged the day of surgery will not be allowed to drive home.  Name and phone number of your driver:Robert-spouse [phone] cell  Special Instructions: N/A              Please read over the following fact sheets you were given: _____________________________________________________________________             [facility] - Preparing for Surgery Before surgery, you can play an important role.  Because skin is not sterile, your skin needs to be as free of germs as possible.  You can reduce the number of germs on your skin by washing with CHG (chlorahexidine gluconate) soap before surgery.  CHG is an antiseptic cleaner which kills germs and bonds with the skin to continue killing germs even after washing. Please DO NOT use if you have an allergy to CHG or  antibacterial soaps.  If your skin becomes reddened/irritated stop using the CHG and inform your nurse when you arrive at Short Stay. Do not shave (including legs and underarms) for at least 48 hours prior to the first CHG shower.  You may shave your face/neck. Please follow these instructions carefully:  1.  Shower with CHG Soap the night before surgery and the  morning of Surgery.  2.  If you choose to wash your hair, wash your hair first as usual with your  normal  shampoo.  3.  After you shampoo, rinse your hair and body thoroughly to remove the  shampoo.                           4.  Use CHG as you would any other liquid soap.  You can apply chg directly  to the skin and  wash                       Gently with a scrungie or clean washcloth.  5.  Apply the CHG Soap to your body ONLY FROM THE NECK DOWN.   Do not use on face/ open                           Wound or open sores. Avoid contact with eyes, ears mouth and genitals (private parts).                       Wash face,  Genitals (private parts) with your normal soap.             6.  Wash thoroughly, paying special attention to the area where your surgery  will be performed.  7.  Thoroughly rinse your body with warm water from the neck down.  8.  DO NOT shower/wash with your normal soap after using and rinsing off  the CHG Soap.                9.  Pat yourself dry with a clean towel.            10.  Wear clean pajamas.            11.  Place clean sheets on your bed the night of your first shower and do not  sleep with pets. Day of Surgery : Do not apply any lotions/deodorants the morning of surgery.  Please wear clean clothes to the hospital/surgery center.  FAILURE TO FOLLOW THESE INSTRUCTIONS MAY RESULT IN THE CANCELLATION OF YOUR SURGERY PATIENT SIGNATURE_________________________________  NURSE SIGNATURE__________________________________  ________________________________________________________________________   Adam Phenix  An incentive spirometer is a tool that can help keep your lungs clear and active. This tool measures how well you are filling your lungs with each breath. Taking long deep breaths may help reverse or decrease the chance of developing breathing (pulmonary) problems (especially infection) following:  A long period of time when you are unable to move or be active. BEFORE THE PROCEDURE   If the spirometer includes an indicator to show your best effort, your nurse or respiratory therapist will set it to a desired goal.  If possible, sit up straight or lean slightly forward. Try not to slouch.  Hold the incentive spirometer in an upright position. INSTRUCTIONS FOR USE  1. Sit on the edge of your bed if possible, or sit up as far as you can in bed or on a chair. 2. Hold the incentive spirometer in an upright position. 3. Breathe out normally. 4. Place the mouthpiece in your mouth and seal your lips tightly around it. 5. Breathe in slowly and as deeply as possible, raising the piston or the ball toward the top of the column. 6. Hold your breath for 3-5 seconds or for as long as possible. Allow the piston or ball to fall to the bottom of the column. 7. Remove the mouthpiece from your mouth and breathe out normally. 8. Rest for a few seconds and repeat Steps 1 through 7 at least 10 times every 1-2 hours when you are awake. Take your time and take a few normal breaths between deep breaths. 9. The spirometer may include an indicator to show your best effort. Use the indicator as a goal to work toward during each repetition. 10. After each set of 10 deep  breaths, practice coughing to be sure your lungs are clear. If you have an incision (the cut made at the time of surgery), support your incision when coughing by placing a pillow or rolled up towels firmly against it. Once you are able to get out of bed, walk around indoors and cough well. You may stop using the incentive spirometer when  instructed by your caregiver.  RISKS AND COMPLICATIONS  Take your time so you do not get dizzy or light-headed.  If you are in pain, you may need to take or ask for pain medication before doing incentive spirometry. It is harder to take a deep breath if you are having pain. AFTER USE  Rest and breathe slowly and easily.  It can be helpful to keep track of a log of your progress. Your caregiver can provide you with a simple table to help with this. If you are using the spirometer at home, follow these instructions: SEEK MEDICAL CARE IF:   You are having difficultly using the spirometer.  You have trouble using the spirometer as often as instructed.  Your pain medication is not giving enough relief while using the spirometer.  You develop fever of 100.5 F (38.1 C) or higher. SEEK IMMEDIATE MEDICAL CARE IF:   You cough up bloody sputum that had not been present before.  You develop fever of 102 F (38.9 C) or greater.  You develop worsening pain at or near the incision site. MAKE SURE YOU:   Understand these instructions.  Will watch your condition.  Will get help right away if you are not doing well or get worse. Document Released: 10/22/2006 Document Revised: 09/03/2011 Document Reviewed: 12/23/2006 ExitCare Patient Information 2014 [employer], [place].   ________________________________________________________________________  WHAT IS A BLOOD TRANSFUSION? Blood Transfusion Information  A transfusion is the replacement of blood or some of its parts. Blood is made up of multiple cells which provide different functions.  Red blood cells carry oxygen and are used for blood loss replacement.  White blood cells fight against infection.  Platelets control bleeding.  Plasma helps clot blood.  Other blood products are available for specialized needs, such as hemophilia or other clotting disorders. BEFORE THE TRANSFUSION  Who gives blood for transfusions?   Healthy  volunteers who are fully evaluated to make sure their blood is safe. This is blood bank blood. Transfusion therapy is the safest it has ever been in the practice of medicine. Before blood is taken from a donor, a complete history is taken to make sure that person has no history of diseases nor engages in risky social behavior (examples are intravenous drug use or sexual activity with multiple partners). The donor's travel history is screened to minimize risk of transmitting infections, such as malaria. The donated blood is tested for signs of infectious diseases, such as HIV and hepatitis. The blood is then tested to be sure it is compatible with you in order to minimize the chance of a transfusion reaction. If you or a relative donates blood, this is often done in anticipation of surgery and is not appropriate for emergency situations. It takes many days to process the donated blood. RISKS AND COMPLICATIONS Although transfusion therapy is very safe and saves many lives, the main dangers of transfusion include:   Getting an infectious disease.  Developing a transfusion reaction. This is an allergic reaction to something in the blood you were given. Every precaution is taken to prevent this. The decision to have a blood transfusion has  been considered carefully by your caregiver before blood is given. Blood is not given unless the benefits outweigh the risks. AFTER THE TRANSFUSION  Right after receiving a blood transfusion, you will usually feel much better and more energetic. This is especially true if your red blood cells have gotten low (anemic). The transfusion raises the level of the red blood cells which carry oxygen, and this usually causes an energy increase.  The nurse administering the transfusion will monitor you carefully for complications. HOME CARE INSTRUCTIONS  No special instructions are needed after a transfusion. You may find your energy is better. Speak with your caregiver about any  limitations on activity for underlying diseases you may have. SEEK MEDICAL CARE IF:   Your condition is not improving after your transfusion.  You develop redness or irritation at the intravenous (IV) site. SEEK IMMEDIATE MEDICAL CARE IF:  Any of the following symptoms occur over the next 12 hours:  Shaking chills.  You have a temperature by mouth above 102 F (38.9 C), not controlled by medicine.  Chest, back, or muscle pain.  People around you feel you are not acting correctly or are confused.  Shortness of breath or difficulty breathing.  Dizziness and fainting.  You get a rash or develop hives.  You have a decrease in urine output.  Your urine turns a dark color or changes to pink, red, or brown. Any of the following symptoms occur over the next 10 days:  You have a temperature by mouth above 102 F (38.9 C), not controlled by medicine.  Shortness of breath.  Weakness after normal activity.  The white part of the eye turns yellow (jaundice).  You have a decrease in the amount of urine or are urinating less often.  Your urine turns a dark color or changes to pink, red, or brown. Document Released: 06/08/2000 Document Revised: 09/03/2011 Document Reviewed: 01/26/2008 [facility] Patient Information 2014 [place], [place].  _______________________________________________________________________

## 2016-06-26 ENCOUNTER — Encounter (HOSPITAL_COMMUNITY): Payer: Self-pay

## 2016-06-26 ENCOUNTER — Encounter (HOSPITAL_COMMUNITY)
Admission: RE | Admit: 2016-06-26 | Discharge: 2016-06-26 | Disposition: A | Discharge status: Home / Self Care | Payer: Managed Care, Other (non HMO) | Source: Ambulatory Visit | Attending: Orthopedic Surgery | Admitting: Orthopedic Surgery

## 2016-06-26 DIAGNOSIS — Z96649 Presence of unspecified artificial hip joint: Secondary | ICD-10-CM | POA: Diagnosis not present

## 2016-06-26 DIAGNOSIS — I252 Old myocardial infarction: Secondary | ICD-10-CM | POA: Diagnosis not present

## 2016-06-26 DIAGNOSIS — Z01812 Encounter for preprocedural laboratory examination: Secondary | ICD-10-CM | POA: Diagnosis not present

## 2016-06-26 DIAGNOSIS — E669 Obesity, unspecified: Secondary | ICD-10-CM | POA: Diagnosis not present

## 2016-06-26 DIAGNOSIS — M1990 Unspecified osteoarthritis, unspecified site: Secondary | ICD-10-CM | POA: Diagnosis not present

## 2016-06-26 DIAGNOSIS — Z01810 Encounter for preprocedural cardiovascular examination: Secondary | ICD-10-CM | POA: Diagnosis present

## 2016-06-26 DIAGNOSIS — F329 Major depressive disorder, single episode, unspecified: Secondary | ICD-10-CM | POA: Diagnosis not present

## 2016-06-26 DIAGNOSIS — F988 Other specified behavioral and emotional disorders with onset usually occurring in childhood and adolescence: Secondary | ICD-10-CM | POA: Insufficient documentation

## 2016-06-26 DIAGNOSIS — D500 Iron deficiency anemia secondary to blood loss (chronic): Secondary | ICD-10-CM | POA: Diagnosis not present

## 2016-06-26 DIAGNOSIS — I10 Essential (primary) hypertension: Secondary | ICD-10-CM | POA: Diagnosis not present

## 2016-06-26 LAB — BASIC METABOLIC PANEL
ANION GAP: 10 (ref 5–15)
BUN: 15 mg/dL (ref 6–20)
CALCIUM: 10.1 mg/dL (ref 8.9–10.3)
CO2: 29 mmol/L (ref 22–32)
CREATININE: 0.88 mg/dL (ref 0.44–1.00)
Chloride: 97 mmol/L — ABNORMAL LOW (ref 101–111)
GFR calc non Af Amer: >60 mL/min (ref >60)
Glucose, Bld: 110 mg/dL — ABNORMAL HIGH (ref 65–99)
Potassium: 4.2 mmol/L (ref 3.5–5.1)
SODIUM: 136 mmol/L (ref 135–145)

## 2016-06-26 LAB — SURGICAL PCR SCREEN
MRSA, PCR: NEGATIVE
Staphylococcus aureus: NEGATIVE

## 2016-06-26 LAB — CBC
HCT: 44 % (ref 36.0–46.0)
HEMOGLOBIN: 14.9 g/dL (ref 12.0–15.0)
MCH: 29.7 pg (ref 26.0–34.0)
MCHC: 33.9 g/dL (ref 30.0–36.0)
MCV: 87.8 fL (ref 78.0–100.0)
PLATELETS: 353 10*3/uL (ref 150–400)
RBC: 5.01 MIL/uL (ref 3.87–5.11)
RDW: 12.5 % (ref 11.5–15.5)
WBC: 8.1 10*3/uL (ref 4.0–10.5)

## 2016-06-26 NOTE — Pre-Procedure Instructions (Signed)
EKG done today. Clearance Dr. Mechele ClaudeHepler,PCP 05-22-16 with chart

## 2016-07-01 NOTE — Anesthesia Preprocedure Evaluation (Addendum)
Anesthesia Evaluation  Patient identified by MRN, date of birth, ID band Patient awake    Reviewed: Allergy & Precautions, H&P , NPO status , Patient's Chart, lab work & pertinent test results  History of Anesthesia Complications (+) PONV and history of anesthetic complications  Airway Mallampati: II  TM Distance: >3 FB Neck ROM: Full    Dental  (+) Dental Advisory Given   Pulmonary neg pulmonary ROS,    breath sounds clear to auscultation       Cardiovascular hypertension, Pt. on medications  Rhythm:Regular Rate:Normal     Neuro/Psych PSYCHIATRIC DISORDERS Depression negative neurological ROS  negative psych ROS   GI/Hepatic negative GI ROS, Neg liver ROS,   Endo/Other  negative endocrine ROS  Renal/GU negative Renal ROS     Musculoskeletal  (+) Arthritis ,   Abdominal   Peds  Hematology negative hematology ROS (+) anemia ,   Anesthesia Other Findings   Reproductive/Obstetrics negative OB ROS                             Anesthesia Physical  Anesthesia Plan  ASA: II  Anesthesia Plan: Spinal and Regional   Post-op Pain Management:  Regional for Post-op pain   Induction:   Airway Management Planned:   Additional Equipment:   Intra-op Plan:   Post-operative Plan:   Informed Consent: I have reviewed the patients History and Physical, chart, labs and discussed the procedure including the risks, benefits and alternatives for the proposed anesthesia with the patient or authorized representative who has indicated his/her understanding and acceptance.   Dental advisory given  Plan Discussed with: CRNA  Anesthesia Plan Comments:        Anesthesia Quick Evaluation

## 2016-07-02 ENCOUNTER — Encounter (HOSPITAL_COMMUNITY)
Admission: RE | Disposition: A | Discharge status: Home / Self Care | Payer: Self-pay | Source: Ambulatory Visit | Attending: Orthopedic Surgery

## 2016-07-02 ENCOUNTER — Inpatient Hospital Stay (HOSPITAL_COMMUNITY)
Admission: RE | Admit: 2016-07-02 | Discharge: 2016-07-04 | DRG: 462 | Disposition: A | Discharge status: Home / Self Care | Payer: Managed Care, Other (non HMO) | Source: Ambulatory Visit | Attending: Orthopedic Surgery | Admitting: Orthopedic Surgery

## 2016-07-02 ENCOUNTER — Inpatient Hospital Stay (HOSPITAL_COMMUNITY): Payer: Managed Care, Other (non HMO) | Admitting: Certified Registered Nurse Anesthetist

## 2016-07-02 ENCOUNTER — Encounter (HOSPITAL_COMMUNITY): Payer: Self-pay | Admitting: *Deleted

## 2016-07-02 DIAGNOSIS — M659 Synovitis and tenosynovitis, unspecified: Secondary | ICD-10-CM | POA: Diagnosis present

## 2016-07-02 DIAGNOSIS — Z6831 Body mass index (BMI) 31.0-31.9, adult: Secondary | ICD-10-CM

## 2016-07-02 DIAGNOSIS — M170 Bilateral primary osteoarthritis of knee: Principal | ICD-10-CM | POA: Diagnosis present

## 2016-07-02 DIAGNOSIS — E669 Obesity, unspecified: Secondary | ICD-10-CM | POA: Diagnosis present

## 2016-07-02 DIAGNOSIS — Z96653 Presence of artificial knee joint, bilateral: Secondary | ICD-10-CM

## 2016-07-02 DIAGNOSIS — I959 Hypotension, unspecified: Secondary | ICD-10-CM | POA: Diagnosis present

## 2016-07-02 DIAGNOSIS — Z96642 Presence of left artificial hip joint: Secondary | ICD-10-CM | POA: Diagnosis present

## 2016-07-02 DIAGNOSIS — Z96659 Presence of unspecified artificial knee joint: Secondary | ICD-10-CM

## 2016-07-02 HISTORY — PX: TOTAL KNEE ARTHROPLASTY: SHX125

## 2016-07-02 LAB — TYPE AND SCREEN
ABO/RH(D): A POS
Antibody Screen: NEGATIVE

## 2016-07-02 SURGERY — ARTHROPLASTY, KNEE, BILATERAL, TOTAL
Anesthesia: Regional | Site: Knee | Laterality: Bilateral

## 2016-07-02 MED ORDER — PHENOL 1.4 % MT LIQD
1.0000 | OROMUCOSAL | Status: DC | PRN
  Filled 2016-07-02: qty 177

## 2016-07-02 MED ORDER — FERROUS SULFATE 325 (65 FE) MG PO TABS
325.0000 mg | ORAL_TABLET | Freq: Three times a day (TID) | ORAL | Status: DC
  Administered 2016-07-03 – 2016-07-04 (×4): 325 mg via ORAL
  Filled 2016-07-02 (×4): qty 1

## 2016-07-02 MED ORDER — MIDAZOLAM HCL 5 MG/5ML IJ SOLN
INTRAMUSCULAR | Status: DC | PRN
  Administered 2016-07-02 (×4): 1 mg via INTRAVENOUS

## 2016-07-02 MED ORDER — MENTHOL 3 MG MT LOZG: 1.0000 | LOZENGE | OROMUCOSAL | Status: DC | PRN

## 2016-07-02 MED ORDER — CLONAZEPAM 1 MG PO TABS
1.0000 mg | ORAL_TABLET | Freq: Two times a day (BID) | ORAL | Status: DC | PRN
  Administered 2016-07-02: 21:00:00 1 mg via ORAL
  Filled 2016-07-02: qty 1

## 2016-07-02 MED ORDER — STERILE WATER FOR IRRIGATION IR SOLN
Status: DC | PRN
  Administered 2016-07-02: 2000 mL

## 2016-07-02 MED ORDER — SODIUM CHLORIDE 0.9 % IJ SOLN
INTRAMUSCULAR | Status: AC
  Filled 2016-07-02: qty 20

## 2016-07-02 MED ORDER — BISACODYL 10 MG RE SUPP: 10.0000 mg | Freq: Every day | RECTAL | Status: DC | PRN

## 2016-07-02 MED ORDER — ONDANSETRON HCL 4 MG/2ML IJ SOLN
INTRAMUSCULAR | Status: DC | PRN
  Administered 2016-07-02: 4 mg via INTRAVENOUS

## 2016-07-02 MED ORDER — DEXAMETHASONE SODIUM PHOSPHATE 10 MG/ML IJ SOLN
10.0000 mg | Freq: Once | INTRAMUSCULAR | Status: AC
  Administered 2016-07-03: 10:00:00 10 mg via INTRAVENOUS
  Filled 2016-07-02: qty 1

## 2016-07-02 MED ORDER — CEFAZOLIN SODIUM-DEXTROSE 2-4 GM/100ML-% IV SOLN
INTRAVENOUS | Status: AC
  Filled 2016-07-02: qty 100

## 2016-07-02 MED ORDER — 0.9 % SODIUM CHLORIDE (POUR BTL) OPTIME
TOPICAL | Status: DC | PRN
  Administered 2016-07-02: 1000 mL

## 2016-07-02 MED ORDER — PHENYLEPHRINE HCL 10 MG/ML IJ SOLN
INTRAVENOUS | Status: DC | PRN
  Administered 2016-07-02: 50 ug/min via INTRAVENOUS

## 2016-07-02 MED ORDER — FENTANYL CITRATE (PF) 100 MCG/2ML IJ SOLN
INTRAMUSCULAR | Status: DC | PRN
  Administered 2016-07-02 (×2): 25 ug via INTRAVENOUS

## 2016-07-02 MED ORDER — PHENYLEPHRINE HCL 10 MG/ML IJ SOLN
INTRAMUSCULAR | Status: AC
  Filled 2016-07-02: qty 1

## 2016-07-02 MED ORDER — BUPIVACAINE HCL (PF) 0.25 % IJ SOLN
INTRAMUSCULAR | Status: AC
  Filled 2016-07-02: qty 30

## 2016-07-02 MED ORDER — PROPOFOL 10 MG/ML IV BOLUS
INTRAVENOUS | Status: DC | PRN
  Administered 2016-07-02: 40 mg via INTRAVENOUS
  Administered 2016-07-02: 30 mg via INTRAVENOUS
  Administered 2016-07-02 (×5): 20 mg via INTRAVENOUS

## 2016-07-02 MED ORDER — BUPIVACAINE HCL (PF) 0.5 % IJ SOLN
INTRAMUSCULAR | Status: DC | PRN
  Administered 2016-07-02: 3 mL

## 2016-07-02 MED ORDER — AMPHETAMINE-DEXTROAMPHETAMINE 10 MG PO TABS: 20.0000 mg | ORAL_TABLET | Freq: Two times a day (BID) | ORAL | Status: DC

## 2016-07-02 MED ORDER — DEXAMETHASONE SODIUM PHOSPHATE 10 MG/ML IJ SOLN
INTRAMUSCULAR | Status: AC
  Filled 2016-07-02: qty 1

## 2016-07-02 MED ORDER — FENTANYL CITRATE (PF) 100 MCG/2ML IJ SOLN
INTRAMUSCULAR | Status: DC | PRN
  Administered 2016-07-02 (×2): 50 ug via INTRAVENOUS

## 2016-07-02 MED ORDER — SODIUM CHLORIDE 0.9 % IV BOLUS (SEPSIS)
500.0000 mL | Freq: Once | INTRAVENOUS | Status: AC
  Administered 2016-07-02: 17:00:00 500 mL via INTRAVENOUS

## 2016-07-02 MED ORDER — LIDOCAINE HCL (CARDIAC) 20 MG/ML IV SOLN
INTRAVENOUS | Status: DC | PRN
  Administered 2016-07-02: 100 mg via INTRAVENOUS

## 2016-07-02 MED ORDER — BUPROPION HCL ER (XL) 150 MG PO TB24
150.0000 mg | ORAL_TABLET | Freq: Every morning | ORAL | Status: DC
  Administered 2016-07-03 – 2016-07-04 (×2): 150 mg via ORAL
  Filled 2016-07-02 (×2): qty 1

## 2016-07-02 MED ORDER — ONDANSETRON HCL 4 MG PO TABS: 4.0000 mg | ORAL_TABLET | Freq: Four times a day (QID) | ORAL | Status: DC | PRN

## 2016-07-02 MED ORDER — TRANEXAMIC ACID 1000 MG/10ML IV SOLN
1000.0000 mg | INTRAVENOUS | Status: AC
  Administered 2016-07-02: 1000 mg via INTRAVENOUS
  Filled 2016-07-02: qty 1100

## 2016-07-02 MED ORDER — PROPOFOL 500 MG/50ML IV EMUL
INTRAVENOUS | Status: DC | PRN
  Administered 2016-07-02: 100 ug/kg/min via INTRAVENOUS

## 2016-07-02 MED ORDER — SODIUM CHLORIDE 0.9 % IV SOLN
INTRAVENOUS | Status: DC
  Administered 2016-07-02: 17:00:00 via INTRAVENOUS
  Filled 2016-07-02 (×3): qty 1000

## 2016-07-02 MED ORDER — OXYCODONE HCL 5 MG PO TABS
5.0000 mg | ORAL_TABLET | ORAL | Status: DC
  Administered 2016-07-02 – 2016-07-04 (×13): 15 mg via ORAL
  Filled 2016-07-02 (×13): qty 3

## 2016-07-02 MED ORDER — ALUM & MAG HYDROXIDE-SIMETH 200-200-20 MG/5ML PO SUSP: 30.0000 mL | ORAL | Status: DC | PRN

## 2016-07-02 MED ORDER — RIVAROXABAN 10 MG PO TABS
10.0000 mg | ORAL_TABLET | ORAL | Status: DC
  Administered 2016-07-03 – 2016-07-04 (×2): 10 mg via ORAL
  Filled 2016-07-02 (×2): qty 1

## 2016-07-02 MED ORDER — MEPERIDINE HCL 50 MG/ML IJ SOLN: 6.2500 mg | INTRAMUSCULAR | Status: DC | PRN

## 2016-07-02 MED ORDER — BUPIVACAINE-EPINEPHRINE (PF) 0.5% -1:200000 IJ SOLN
INTRAMUSCULAR | Status: DC | PRN
  Administered 2016-07-02: 40 mL via PERINEURAL

## 2016-07-02 MED ORDER — CEFAZOLIN SODIUM-DEXTROSE 2-4 GM/100ML-% IV SOLN
2.0000 g | Freq: Four times a day (QID) | INTRAVENOUS | Status: AC
  Administered 2016-07-02 (×2): 2 g via INTRAVENOUS
  Filled 2016-07-02 (×2): qty 100

## 2016-07-02 MED ORDER — SODIUM CHLORIDE 0.9 % IJ SOLN
INTRAMUSCULAR | Status: AC
  Filled 2016-07-02: qty 50

## 2016-07-02 MED ORDER — LIDOCAINE 2% (20 MG/ML) 5 ML SYRINGE
INTRAMUSCULAR | Status: AC
  Filled 2016-07-02: qty 5

## 2016-07-02 MED ORDER — METHOCARBAMOL 500 MG PO TABS
500.0000 mg | ORAL_TABLET | Freq: Four times a day (QID) | ORAL | Status: DC | PRN
  Administered 2016-07-03 – 2016-07-04 (×4): 500 mg via ORAL
  Filled 2016-07-02 (×5): qty 1

## 2016-07-02 MED ORDER — DEXAMETHASONE SODIUM PHOSPHATE 10 MG/ML IJ SOLN
10.0000 mg | Freq: Once | INTRAMUSCULAR | Status: AC
  Administered 2016-07-02: 10 mg via INTRAVENOUS

## 2016-07-02 MED ORDER — MIDAZOLAM HCL 2 MG/2ML IJ SOLN
INTRAMUSCULAR | Status: AC
  Filled 2016-07-02: qty 2

## 2016-07-02 MED ORDER — FENTANYL CITRATE (PF) 100 MCG/2ML IJ SOLN
INTRAMUSCULAR | Status: AC
  Filled 2016-07-02: qty 2

## 2016-07-02 MED ORDER — DIPHENHYDRAMINE HCL 25 MG PO CAPS: 25.0000 mg | ORAL_CAPSULE | Freq: Four times a day (QID) | ORAL | Status: DC | PRN

## 2016-07-02 MED ORDER — PROPOFOL 10 MG/ML IV BOLUS
INTRAVENOUS | Status: AC
  Filled 2016-07-02: qty 60

## 2016-07-02 MED ORDER — PHENYLEPHRINE 40 MCG/ML (10ML) SYRINGE FOR IV PUSH (FOR BLOOD PRESSURE SUPPORT)
PREFILLED_SYRINGE | INTRAVENOUS | Status: AC
  Filled 2016-07-02: qty 10

## 2016-07-02 MED ORDER — CHLORHEXIDINE GLUCONATE 4 % EX LIQD: 60.0000 mL | Freq: Once | CUTANEOUS | Status: DC

## 2016-07-02 MED ORDER — PROPOFOL 10 MG/ML IV BOLUS
INTRAVENOUS | Status: AC
  Filled 2016-07-02: qty 20

## 2016-07-02 MED ORDER — KETOROLAC TROMETHAMINE 30 MG/ML IJ SOLN
INTRAMUSCULAR | Status: AC
  Filled 2016-07-02: qty 1

## 2016-07-02 MED ORDER — HYDROMORPHONE HCL 1 MG/ML IJ SOLN: 0.2500 mg | INTRAMUSCULAR | Status: DC | PRN

## 2016-07-02 MED ORDER — BUPIVACAINE HCL (PF) 0.5 % IJ SOLN
INTRAMUSCULAR | Status: AC
  Filled 2016-07-02: qty 60

## 2016-07-02 MED ORDER — MAGNESIUM CITRATE PO SOLN: 1.0000 | Freq: Once | ORAL | Status: DC | PRN

## 2016-07-02 MED ORDER — DEXTROSE 5 % IV SOLN
500.0000 mg | Freq: Four times a day (QID) | INTRAVENOUS | Status: DC | PRN
  Administered 2016-07-02: 500 mg via INTRAVENOUS
  Filled 2016-07-02: qty 550
  Filled 2016-07-02: qty 5

## 2016-07-02 MED ORDER — LACTATED RINGERS IV SOLN: INTRAVENOUS | Status: DC

## 2016-07-02 MED ORDER — POLYETHYLENE GLYCOL 3350 17 G PO PACK
17.0000 g | PACK | Freq: Two times a day (BID) | ORAL | Status: DC
  Administered 2016-07-02 – 2016-07-03 (×3): 17 g via ORAL
  Filled 2016-07-02 (×3): qty 1

## 2016-07-02 MED ORDER — SERTRALINE HCL 25 MG PO TABS
25.0000 mg | ORAL_TABLET | Freq: Every morning | ORAL | Status: DC
  Administered 2016-07-03 – 2016-07-04 (×2): 25 mg via ORAL
  Filled 2016-07-02 (×2): qty 1

## 2016-07-02 MED ORDER — SODIUM CHLORIDE 0.9 % IR SOLN
Status: DC | PRN
  Administered 2016-07-02: 2000 mL

## 2016-07-02 MED ORDER — HYDROMORPHONE HCL 1 MG/ML IJ SOLN
0.5000 mg | INTRAMUSCULAR | Status: DC | PRN
  Administered 2016-07-02 (×3): 1 mg via INTRAVENOUS
  Filled 2016-07-02 (×3): qty 1

## 2016-07-02 MED ORDER — ONDANSETRON HCL 4 MG/2ML IJ SOLN
4.0000 mg | Freq: Four times a day (QID) | INTRAMUSCULAR | Status: DC | PRN
  Administered 2016-07-02: 4 mg via INTRAVENOUS
  Filled 2016-07-02: qty 2

## 2016-07-02 MED ORDER — IRBESARTAN 150 MG PO TABS
150.0000 mg | ORAL_TABLET | Freq: Every day | ORAL | Status: DC
  Administered 2016-07-03: 150 mg via ORAL
  Filled 2016-07-02: qty 1

## 2016-07-02 MED ORDER — ONDANSETRON HCL 4 MG/2ML IJ SOLN
INTRAMUSCULAR | Status: AC
  Filled 2016-07-02: qty 2

## 2016-07-02 MED ORDER — DOCUSATE SODIUM 100 MG PO CAPS
100.0000 mg | ORAL_CAPSULE | Freq: Two times a day (BID) | ORAL | Status: DC
  Administered 2016-07-02 – 2016-07-04 (×4): 100 mg via ORAL
  Filled 2016-07-02 (×4): qty 1

## 2016-07-02 MED ORDER — METOCLOPRAMIDE HCL 5 MG/ML IJ SOLN
5.0000 mg | Freq: Three times a day (TID) | INTRAMUSCULAR | Status: DC | PRN
  Filled 2016-07-02: qty 2

## 2016-07-02 MED ORDER — KETAMINE HCL 10 MG/ML IJ SOLN
INTRAMUSCULAR | Status: DC | PRN
  Administered 2016-07-02: 20 mg via INTRAVENOUS
  Administered 2016-07-02 (×6): 2 mg via INTRAVENOUS
  Administered 2016-07-02: 4 mg via INTRAVENOUS
  Administered 2016-07-02: 2 mg via INTRAVENOUS
  Administered 2016-07-02: 4 mg via INTRAVENOUS

## 2016-07-02 MED ORDER — PHENYLEPHRINE HCL 10 MG/ML IJ SOLN
INTRAMUSCULAR | Status: DC | PRN
  Administered 2016-07-02: 80 ug via INTRAVENOUS
  Administered 2016-07-02: 120 ug via INTRAVENOUS
  Administered 2016-07-02: 40 ug via INTRAVENOUS
  Administered 2016-07-02 (×2): 80 ug via INTRAVENOUS

## 2016-07-02 MED ORDER — CEFAZOLIN SODIUM-DEXTROSE 2-4 GM/100ML-% IV SOLN
2.0000 g | INTRAVENOUS | Status: AC
  Administered 2016-07-02: 2 g via INTRAVENOUS

## 2016-07-02 MED ORDER — CELECOXIB 200 MG PO CAPS
200.0000 mg | ORAL_CAPSULE | Freq: Two times a day (BID) | ORAL | Status: DC
  Administered 2016-07-02 – 2016-07-04 (×4): 200 mg via ORAL
  Filled 2016-07-02 (×4): qty 1

## 2016-07-02 MED ORDER — PROMETHAZINE HCL 25 MG/ML IJ SOLN: 6.2500 mg | INTRAMUSCULAR | Status: DC | PRN

## 2016-07-02 MED ORDER — KETAMINE HCL 10 MG/ML IJ SOLN
INTRAMUSCULAR | Status: AC
  Filled 2016-07-02: qty 1

## 2016-07-02 MED ORDER — METOCLOPRAMIDE HCL 5 MG PO TABS: 5.0000 mg | ORAL_TABLET | Freq: Three times a day (TID) | ORAL | Status: DC | PRN

## 2016-07-02 MED ORDER — LACTATED RINGERS IV SOLN
INTRAVENOUS | Status: DC | PRN
  Administered 2016-07-02 (×3): via INTRAVENOUS

## 2016-07-02 MED ORDER — HYDROCHLOROTHIAZIDE 25 MG PO TABS
25.0000 mg | ORAL_TABLET | Freq: Every morning | ORAL | Status: DC
  Administered 2016-07-03: 25 mg via ORAL
  Filled 2016-07-02: qty 1

## 2016-07-02 SURGICAL SUPPLY — 58 items
BAG ZIPLOCK 12X15 (MISCELLANEOUS) IMPLANT
BANDAGE ACE 6X5 VEL STRL LF (GAUZE/BANDAGES/DRESSINGS) ×6 IMPLANT
BANDAGE ESMARK 6X9 LF (GAUZE/BANDAGES/DRESSINGS) ×2 IMPLANT
BLADE SAW SGTL 13.0X1.19X90.0M (BLADE) ×6 IMPLANT
BLADE SURG SZ10 CARB STEEL (BLADE) ×6 IMPLANT
BNDG COHESIVE 4X5 TAN STRL (GAUZE/BANDAGES/DRESSINGS) ×3 IMPLANT
BNDG ESMARK 6X9 LF (GAUZE/BANDAGES/DRESSINGS) ×6
BOWL SMART MIX CTS (DISPOSABLE) ×6 IMPLANT
CAPT KNEE TOTAL 3 ATTUNE ×6 IMPLANT
CEMENT HV SMART SET (Cement) ×12 IMPLANT
CLOTH BEACON ORANGE TIMEOUT ST (SAFETY) ×3 IMPLANT
CUFF TOURN SGL QUICK 34 (TOURNIQUET CUFF) ×4
CUFF TRNQT CYL 34X4X40X1 (TOURNIQUET CUFF) ×2 IMPLANT
DECANTER SPIKE VIAL GLASS SM (MISCELLANEOUS) ×3 IMPLANT
DERMABOND ADVANCED (GAUZE/BANDAGES/DRESSINGS) ×4
DERMABOND ADVANCED .7 DNX12 (GAUZE/BANDAGES/DRESSINGS) ×2 IMPLANT
DRAPE EXTREMITY BILATERAL (DRAPES) ×3 IMPLANT
DRAPE INCISE IOBAN 66X45 STRL (DRAPES) ×3 IMPLANT
DRAPE SHEET LG 3/4 BI-LAMINATE (DRAPES) ×3 IMPLANT
DRAPE U-SHAPE 47X51 STRL (DRAPES) ×9 IMPLANT
DRESSING AQUACEL AG SP 3.5X10 (GAUZE/BANDAGES/DRESSINGS) ×2 IMPLANT
DRSG AQUACEL AG SP 3.5X10 (GAUZE/BANDAGES/DRESSINGS) ×6
DURAPREP 26ML APPLICATOR (WOUND CARE) ×6 IMPLANT
ELECT REM PT RETURN 9FT ADLT (ELECTROSURGICAL) ×3
ELECTRODE REM PT RTRN 9FT ADLT (ELECTROSURGICAL) ×1 IMPLANT
FACESHIELD WRAPAROUND (MASK) ×9 IMPLANT
GLOVE BIOGEL M 7.0 STRL (GLOVE) IMPLANT
GLOVE BIOGEL PI IND STRL 7.0 (GLOVE) ×3 IMPLANT
GLOVE BIOGEL PI IND STRL 7.5 (GLOVE) ×1 IMPLANT
GLOVE BIOGEL PI IND STRL 8.5 (GLOVE) ×1 IMPLANT
GLOVE BIOGEL PI INDICATOR 7.0 (GLOVE) ×6
GLOVE BIOGEL PI INDICATOR 7.5 (GLOVE) ×2
GLOVE BIOGEL PI INDICATOR 8.5 (GLOVE) ×2
GLOVE ECLIPSE 8.0 STRL XLNG CF (GLOVE) ×9 IMPLANT
GLOVE ORTHO TXT STRL SZ7.5 (GLOVE) ×9 IMPLANT
GOWN STRL REUS W/TWL LRG LVL3 (GOWN DISPOSABLE) ×3 IMPLANT
GOWN STRL REUS W/TWL XL LVL3 (GOWN DISPOSABLE) ×3 IMPLANT
HANDPIECE INTERPULSE COAX TIP (DISPOSABLE) ×2
MANIFOLD NEPTUNE II (INSTRUMENTS) ×3 IMPLANT
NDL SAFETY ECLIPSE 18X1.5 (NEEDLE) ×2 IMPLANT
NEEDLE HYPO 18GX1.5 SHARP (NEEDLE) ×4
NS IRRIG 1000ML POUR BTL (IV SOLUTION) ×3 IMPLANT
PACK TOTAL KNEE CUSTOM (KITS) ×3 IMPLANT
SET HNDPC FAN SPRY TIP SCT (DISPOSABLE) ×1 IMPLANT
SET PAD KNEE POSITIONER (MISCELLANEOUS) ×6 IMPLANT
SPONGE LAP 18X18 X RAY DECT (DISPOSABLE) ×3 IMPLANT
STOCKINETTE 8 INCH (MISCELLANEOUS) ×3 IMPLANT
SUT MNCRL AB 4-0 PS2 18 (SUTURE) ×6 IMPLANT
SUT VIC AB 1 CT1 36 (SUTURE) ×6 IMPLANT
SUT VIC AB 2-0 CT1 27 (SUTURE) ×8
SUT VIC AB 2-0 CT1 TAPERPNT 27 (SUTURE) ×4 IMPLANT
SUT VLOC 180 0 24IN GS25 (SUTURE) ×6 IMPLANT
SYRINGE 60CC LL (MISCELLANEOUS) ×3 IMPLANT
TOWEL OR 17X26 10 PK STRL BLUE (TOWEL DISPOSABLE) ×3 IMPLANT
TRAY FOLEY W/METER SILVER 16FR (SET/KITS/TRAYS/PACK) ×3 IMPLANT
WATER STERILE IRR 1500ML POUR (IV SOLUTION) ×3 IMPLANT
WRAP KNEE MAXI GEL POST OP (GAUZE/BANDAGES/DRESSINGS) ×6 IMPLANT
YANKAUER SUCT BULB TIP 10FT TU (MISCELLANEOUS) IMPLANT

## 2016-07-02 NOTE — Progress Notes (Signed)
AssistedDr. Renold DonGermeroth with right, left, ultrasound guided, adductor canal blocks. Side rails up, monitors on throughout procedure. See vital signs in flow sheet. Tolerated Procedure well.

## 2016-07-02 NOTE — Anesthesia Procedure Notes (Signed)
Anesthesia Regional Block:  Adductor canal block  Pre-Anesthetic Checklist: ,, timeout performed, Correct Patient, Correct Site, Correct Laterality, Correct Procedure, Correct Position, site marked, Risks and benefits discussed,  Surgical consent,  Pre-op evaluation,  At surgeon's request and post-op pain management  Laterality: Right and Left  Prep: chloraprep       Needles:  Injection technique: Single-shot  Needle Type: Stimiplex     Needle Length: 9cm 9 cm Needle Gauge: 21 and 21 G    Additional Needles:  Procedures: ultrasound guided (picture in chart) Adductor canal block Narrative:  Start time: 07/02/2016 8:15 AM End time: 07/02/2016 8:35 AM Injection made incrementally with aspirations every 5 mL.  Performed by: Personally  Anesthesiologist: Lewie LoronGERMEROTH, Terance Pomplun  Additional Notes: BP cuff, EKG monitors applied. Sedation begun. Artery and nerve location verified with U/S and anesthetic injected incrementally, slowly, and after negative aspirations under direct u/s guidance. Good fascial /perineural spread. Tolerated well.

## 2016-07-02 NOTE — Anesthesia Postprocedure Evaluation (Signed)
Anesthesia Post Note  Patient: Gwenith DailyRebecca Boyd  Procedure(s) Performed: Procedure(s) (LRB): TOTAL KNEE BILATERAL ARTHROPLASTY (Bilateral)  Patient location during evaluation: PACU Anesthesia Type: Spinal and Regional Level of consciousness: awake and alert Pain management: pain level controlled Vital Signs Assessment: post-procedure vital signs reviewed and stable Respiratory status: spontaneous breathing and respiratory function stable Cardiovascular status: blood pressure returned to baseline and stable Postop Assessment: spinal receding Anesthetic complications: no       Last Vitals:  Vitals:   07/02/16 1315 07/02/16 1334  BP: 118/82 117/78  Pulse: (!) 57 60  Resp: 15 16  Temp: 36.5 C 36.5 C    Last Pain:  Vitals:   07/02/16 1245  TempSrc:   PainSc: Asleep                 Lewie LoronJohn Temprence Rhines

## 2016-07-02 NOTE — Evaluation (Signed)
Physical Therapy Evaluation Patient Details Name: Taylor [name] MRN: [id number] DOB: [birth year] Today's Date: 07/02/2016   History of Present Illness  Bilateral TKA 07/02/16  Clinical Impression  The patient was feeling well, minimal pain of the knees until  After sitting on the bed edge. Patient encouraged to  Limit the ROM and exercises while sitting. Did not attempt standing. After return to supine, the patient noted to be nauseated, pale and mildly diaphoretic. BP 88/52. Marland Kitchen HR 67 and oxygen sats 99% RA. RN aware. Subsequent BP 101/68. 2 liters NCreplaced. RN remained in the room. Pt admitted with above diagnosis. Pt currently with functional limitations due to the deficits listed below (see PT Problem List). Pt will benefit from skilled PT to increase their independence and safety with mobility to allow discharge to the venue listed below.       Follow Up Recommendations Home health PT;Supervision/Assistance - 24 hour    Equipment Recommendations  None recommended by PT    Recommendations for Other Services       Precautions / Restrictions Precautions Precautions: Fall Precaution Comments: hypotn on eval       Mobility  Bed Mobility Overal bed mobility: Needs Assistance Bed Mobility: Supine to Sit;Sit to Supine     Supine to sit: Min assist Sit to supine: Mod assist   General bed mobility comments: patient used upper body, supported the legs until sitting on bed edge. Assisted with legs onto bed.  Transfers                 General transfer comment: NT-not ready  Ambulation/Gait                Stairs            Wheelchair Mobility    Modified Rankin (Stroke Patients Only)       Balance                                             Pertinent Vitals/Pain Pain Assessment: 0-10 Pain Score: 7  Pain Location: both knees Pain Descriptors / Indicators: Aching;Grimacing;Guarding Pain Intervention(s): Limited activity within  patient's tolerance;Monitored during session;Premedicated before session;Repositioned;Patient requesting pain meds-RN notified;Ice applied    Home Living Family/patient expects to be discharged to:: Private residence Living Arrangements: Spouse/significant other Available Help at Discharge: Family;Friend(s) Type of Home: House Home Access: Stairs to enter Entrance Stairs-Rails: None Entrance Stairs-Number of Steps: 3 Home Layout: Two level;1/2 bath on main level Home Equipment: Bedside commode;Crutches;Cane - single point;Walker - 2 wheels Additional Comments: will stay on first level    Prior Function Level of Independence: Independent               Hand Dominance        Extremity/Trunk Assessment   Upper Extremity Assessment Upper Extremity Assessment: Overall WFL for tasks assessed    Lower Extremity Assessment Lower Extremity Assessment: RLE deficits/detail;LLE deficits/detail RLE Deficits / Details: SLR with lag, knee flexion 10-60 LLE Deficits / Details: same,        Communication   Communication: No difficulties  Cognition Arousal/Alertness: Awake/alert Behavior During Therapy: WFL for tasks assessed/performed Overall Cognitive Status: Within Functional Limits for tasks assessed                 General Comments: patient became more subdues after returning to supine, c/O nausea and  dizziness and  pain.    General Comments      Exercises Total Joint Exercises Long Arc Quad: AROM;Both;5 reps   Assessment/Plan    PT Assessment Patient needs continued PT services  PT Problem List Decreased strength;Decreased range of motion;Decreased activity tolerance;Decreased mobility;Cardiopulmonary status limiting activity;Decreased knowledge of precautions;Decreased safety awareness;Decreased knowledge of use of DME          PT Treatment Interventions DME instruction;Gait training;Stair training;Functional mobility training;Therapeutic  activities;Therapeutic exercise;Patient/family education    PT Goals (Current goals can be found in the Care Plan section)  Acute Rehab PT Goals Patient Stated Goal: to walk PT Goal Formulation: With patient/family Time For Goal Achievement: 07/09/16 Potential to Achieve Goals: Good    Frequency 7X/week   Barriers to discharge        Co-evaluation               End of Session   Activity Tolerance: Patient tolerated treatment well;Treatment limited secondary to medical complications (Comment) Patient left: in bed;with call bell/phone within reach;with bed alarm set;with family/visitor present;with nursing/sitter in room Nurse Communication: Mobility status (pain, low Bp  nausea)         Time: [id number] PT Time Calculation (min) (ACUTE ONLY): 41 min   Charges:   PT Evaluation $PT Eval Moderate Complexity: 1 Procedure PT Treatments $Therapeutic Activity: 23-37 mins   PT G Codes:        Taylor [name], Taylor [name] 07/02/2016, 4:50 PM

## 2016-07-02 NOTE — Op Note (Signed)
NAME:  Taylor [name]                      MEDICAL RECORD NO.:  [id number]                             FACILITY:  [facility]      PHYSICIAN:  Madlyn Frankel. Charlann Boxer, M.D.  DATE OF BIRTH:  March 31, 1961      DATE OF PROCEDURE:  07/02/2016                                     OPERATIVE REPORT   This is a simultaneously performed bilateral TKR case.  We did the left knee first followed by the second.  Below are templated op notes for each knee.  Both knees were performed without complications.       PREOPERATIVE DIAGNOSIS:  Left knee osteoarthritis.      POSTOPERATIVE DIAGNOSIS:  Left knee osteoarthritis.      FINDINGS:  The patient was noted to have complete loss of cartilage and   bone-on-bone arthritis with associated osteophytes in the lateal and patellofemoral compartments of   the knee with a significant synovitis and associated effusion.      PROCEDURE:  Left total knee replacement.      COMPONENTS USED:  DePuy Attune rotating platform posterior stabilized knee   system, a size 5N femur, 4 tibia, size 7 PS AOX insert, and 35 anatomic patellar   button.      SURGEON:  Madlyn Frankel. Charlann Boxer, M.D.      ASSISTANT:  Lanney Gins, PA-C.      ANESTHESIA:  Regional and Spinal.      SPECIMENS:  None.      COMPLICATION:  None.      DRAINS:  None.  EBL: <150cc      TOURNIQUET TIME:   Total Tourniquet Time Documented: Thigh (Right) - 27 minutes Thigh (Right) - 36 minutes Total: Thigh (Right) - 63 minutes  .      The patient was stable to the recovery room.      INDICATION FOR PROCEDURE:  Taylor [name] is a 56 y.o. female patient of   mine.  The patient had been seen, evaluated, and treated conservatively in the   office with medication, activity modification, and injections.  The patient had   radiographic changes of bone-on-bone arthritis with endplate sclerosis and osteophytes noted in both of her knees with retention of her normal range of motion.     The patient failed conservative  measures including medication, injections, and activity modification, and at this point was ready for more definitive measures.   Based on the radiographic changes and failed conservative measures, the patient   decided to proceed with total knee replacement.  Risks of infection,   DVT, component failure, need for revision surgery, postop course, and   expectations were all   discussed and reviewed.  Simultaneous bilateral knees were reviewed with regards to risk and benefits as opposed to staged procedures.  Consent was obtained for benefit of pain   relief.      PROCEDURE IN DETAIL:  The patient was brought to the operative theater.   Once adequate anesthesia, preoperative antibiotics, 2 gm of Ancef, 1 gm of Tranexamic Acid, and 10 mg of Decadron administered, the patient was positioned supine with bilateral thigh tourniquets  placed.  Both lower extremities wer prepped and draped in sterile fashion.  A time-   out was performed identifying the patient, planned procedures, and   extremities. Attention was first directed to the left knee      The both lower extremities were placed in the [facility] leg holders.  The left leg was   exsanguinated, tourniquet elevated to 250 mmHg.  A midline incision was   made followed by median parapatellar arthrotomy.  Following initial   exposure, attention was first directed to the patella.  Precut   measurement was noted to be 21 mm.  I resected down to 13 mm and used a   35 anatomic patellar button to restore patellar height as well as cover the cut   surface.      The lug holes were drilled and a metal shim was placed to protect the   patella from retractors and saw blades.      At this point, attention was now directed to the femur.  The femoral   canal was opened with a drill, irrigated to try to prevent fat emboli.  An   intramedullary rod was passed at 3 degrees valgus, 9 mm of bone was   resected off the distal femur.  Following this resection, the  tibia was   subluxated anteriorly.  Using the extramedullary guide, 2 mm of bone was resected off   the proximal medial tibia.  We confirmed the gap would be   stable medially and laterally with a size 5 spacer block as well as confirmed   the cut was perpendicular in the coronal plane, checking with an alignment rod.      Once this was done, I sized the femur to be a size 5 in the anterior-   posterior dimension, chose a narrow component based on medial and   lateral dimension.  The size 5 rotation block was then pinned in   position anterior referenced using the C-clamp to set rotation.  The   anterior, posterior, and  chamfer cuts were made without difficulty nor   notching making certain that I was along the anterior cortex to help   with flexion gap stability.      The final box cut was made off the lateral aspect of distal femur.      At this point, the tibia was sized to be a size 4, the size 4 tray was   then pinned in position through the medial third of the tubercle,   drilled, and keel punched.  Trial reduction was now carried with a 5 femur,  4 tibia, a size 6 then 7 PS insert, and the 35 anatomic patella botton.  The knee was brought to   extension, full extension with good flexion stability with the patella   tracking through the trochlea without application of pressure.  Given   all these findings the femoral lug holes were drilled and then the trial components removed.  Final components were   opened and cement was mixed.  The knee was irrigated with normal saline   solution and pulse lavage.  No intra-articular block was performed due to pre-operative anesthesia administered     The knee was irrigated.  Final implants were then cemented onto clean and   dried cut surfaces of bone with the knee brought to extension with a size 7 PS trial insert.      Once the cement had fully cured, the excess cement was removed  throughout the knee.  I confirmed I was satisfied with  the range of   motion and stability, and the final size 7 PS AOX insert was chosen.  It was   placed into the knee.      The tourniquet had been let down at 27 minutes.  No significant   hemostasis required.  The   extensor mechanism was then reapproximated using #1 Vicryl and #o V-lock sutures with the knee   in flexion.  The   remaining wound was closed with 2-0 Vicryl and running 4-0 Monocryl.   The knee was cleaned, dried, dressed sterilely using Dermabond and   Aquacel dressing.  The patient was then   brought to recovery room in stable condition, tolerating the procedure   well.             Madlyn Frankel Charlann Boxer, M.D.    07/02/2016 11:46 AM  NAME:  Taylor [name]                      MEDICAL RECORD NO.:  [id number]                             [facility]ACILITY:  [facility]      PHYSICIAN:  Madlyn Frankel. Charlann Boxer, M.D.  DATE OF BIRTH:  1960/12/06      DATE OF PROCEDURE:  07/02/2016                                     OPERATIVE REPORT         PREOPERATIVE DIAGNOSIS:  Right knee osteoarthritis.      POSTOPERATIVE DIAGNOSIS:  Right knee osteoarthritis.      FINDINGS:  The patient was noted to have complete loss of cartilage and   bone-on-bone arthritis with associated osteophytes in the lateral and patellofemoral compartments of   the knee with a significant synovitis and associated effusion.      PROCEDURE:  Right total knee replacement.      COMPONENTS USED:  DePuy Attune rotating platform posterior stabilized knee   system, a size 6N femur, 4 tibia, size 6 PS AOX insert, and 35 anatomic patellar   button.      SURGEON:  Madlyn Frankel. Charlann Boxer, M.D.      ASSISTANT:  Lanney Gins, PA-C.      ANESTHESIA:  Regional and Spinal.      SPECIMENS:  None.      COMPLICATION:  None.      DRAINS:  None.  EBL: <150cc      TOURNIQUET TIME:   Total Tourniquet Time Documented: Thigh (Right) - 27 minutes Thigh (Right) - 36 minutes Total: Thigh (Right) - 63 minutes  .      The patient was stable to  the recovery room.      INDICATION FOR PROCEDURE:  Taylor [name] is a 56 y.o. female patient of   mine.  The patient had been seen, evaluated, and treated conservatively in the   office with medication, activity modification, and injections.  The patient had   radiographic changes of bone-on-bone arthritis with endplate sclerosis and osteophytes noted.      The patient failed conservative measures including medication, injections, and activity modification, and at this point was ready for more definitive measures.   Based on the radiographic changes and failed conservative measures, the patient  decided to proceed with total knee replacement.  Risks of infection,   DVT, component failure, need for revision surgery, postop course, and   expectations were all   discussed and reviewed.  Consent was obtained for benefit of pain   relief.      PROCEDURE IN DETAIL:      The right leg was exsanguinated, tourniquet elevated to 250 mmHg.  A midline incision was   made followed by median parapatellar arthrotomy.  Following initial   exposure, attention was first directed to the patella.  Precut   measurement was noted to be 21 mm.  I resected down to 13-14 mm and used a   35 anatomic patellar button to restore patellar height as well as cover the cut   surface.      The lug holes were drilled and a metal shim was placed to protect the   patella from retractors and saw blades.      At this point, attention was now directed to the femur.  The femoral   canal was opened with a drill, irrigated to try to prevent fat emboli.  An   intramedullary rod was passed at 3 degrees valgus, 9 mm of bone was   resected off the distal femur.  Following this resection, the tibia was   subluxated anteriorly.  Using the extramedullary guide, 2 mm of bone was resected off   the proximal lateral tibia.  We confirmed the gap would be   stable medially and laterally with a size 5 spacer block as well as confirmed    the cut was perpendicular in the coronal plane, checking with an alignment rod.      Once this was done, I sized the femur to be a size 6 in the anterior-   posterior dimension, chose a narrow component based on medial and   lateral dimension.  The size 6 rotation block was then pinned in   position anterior referenced using the C-clamp to set rotation.  The   anterior, posterior, and  chamfer cuts were made without difficulty nor   notching making certain that I was along the anterior cortex to help   with flexion gap stability.      The final box cut was made off the lateral aspect of distal femur.      At this point, the tibia was sized to be a size 4, the size 4 tray was   then pinned in position through the medial third of the tubercle,   drilled, and keel punched.  Trial reduction was now carried with a 6 femur,  4 tibia, a size 5 then 6 PS insert, and the 35 anatomic patella botton.  The knee was brought to   extension, full extension with good flexion stability with the patella   tracking through the trochlea without application of pressure.  Given   all these findings the femoral lug holes were drilled and then the trial components removed.  Final components were   opened and cement was mixed.  The knee was irrigated with normal saline   solution and pulse lavage.  No intra-articular block administered due to pre-operative anesthesia     The knee was irrigated.  Final implants were then cemented onto clean and   dried cut surfaces of bone with the knee brought to extension with a size 6 PS trial insert.      Once the cement had fully cured, the excess cement was removed   throughout  the knee.  I confirmed I was satisfied with the range of   motion and stability, and the final size 6 PS AOX mm insert was chosen.  It was   placed into the knee.      The tourniquet had been let down at 36 minutes.  No significant   hemostasis required.  The   extensor mechanism was then  reapproximated using #1 Vicryl and #0 V-lock sutures with the knee   in flexion.  The   remaining wound was closed with 2-0 Vicryl and running 4-0 Monocryl.   The knee was cleaned, dried, dressed sterilely using Dermabond and   Aquacel dressing.  The patient was then   brought to recovery room in stable condition, tolerating the procedure   well.   Please note that Physician Assistant, Lanney Gins, PA-C, was present for the entirety of both cases, and was utilized for pre-operative positioning, peri-operative retractor management, general facilitation of the procedure.  He was also utilized for primary wound closure at the end of each case.              Madlyn Frankel Charlann Boxer, M.D.    07/02/2016 11:46 AM

## 2016-07-02 NOTE — Interval H&P Note (Signed)
History and Physical Interval Note:  07/02/2016 7:28 AM  Taylor [name]  has presented today for surgery, with the diagnosis of BILATERAL KNEE OA  The various methods of treatment have been discussed with the patient and family. After consideration of risks, benefits and other options for treatment, the patient has consented to  Procedure(s): TOTAL KNEE BILATERAL ARTHROPLASTY (Bilateral) as a surgical intervention .  The patient's history has been reviewed, patient examined, no change in status, stable for surgery.  I have reviewed the patient's chart and labs.  Questions were answered to the patient's satisfaction.     Shelda PalLIN,Shiann Kam D

## 2016-07-02 NOTE — Progress Notes (Signed)
Was called into room by PT,patient was diaphoretic, pale and BP was 88/52 upon sitting up with PT for the first time. Family began fanning patient, gave patient zofran for nausea with no relief, then reglan for nausea with relief. Received an order for a 500mL bolus of NS for BP. After bolus, BP was 134/78. Patient feeling much better at this time moment, resting in bed with no s/s of acute distress. Izetta DakinMatthew Babbish,PA aware.

## 2016-07-02 NOTE — Transfer of Care (Signed)
Immediate Anesthesia Transfer of Care Note  Patient: Ernesha Ramone  Procedure(s) Performed: Procedure(s): TOTAL KNEE BILATERAL ARTHROPLASTY (Bilateral)  Patient Location: PACU  Anesthesia Type:MAC and Spinal  Level of Consciousness: Patient easily awoken, sedated, comfortable, cooperative, following commands, responds to stimulation.   Airway & Oxygen Therapy: Patient spontaneously breathing, ventilating well, oxygen via simple oxygen mask.  Post-op Assessment: Report given to PACU RN, vital signs reviewed and stable, moving all extremities.   Post vital signs: Reviewed and stable.  Complications: No apparent anesthesia complications Last Vitals:  Vitals:   07/02/16 0842 07/02/16 1150  BP:    Pulse: 81   Resp: 14   Temp:  36.5 C    Last Pain:  Vitals:   07/02/16 0630  TempSrc: Oral      Patients Stated Pain Goal: 3 (29/57/47 3403)  Complications: No apparent anesthesia complications

## 2016-07-03 LAB — URINALYSIS, ROUTINE W REFLEX MICROSCOPIC
Bilirubin Urine: NEGATIVE
GLUCOSE, UA: 50 mg/dL — AB
HGB URINE DIPSTICK: NEGATIVE
Ketones, ur: NEGATIVE mg/dL
LEUKOCYTES UA: NEGATIVE
NITRITE: NEGATIVE
PROTEIN: 30 mg/dL — AB
Specific Gravity, Urine: 1.035 — ABNORMAL HIGH (ref 1.005–1.030)
pH: 5 (ref 5.0–8.0)

## 2016-07-03 LAB — CBC
HEMATOCRIT: 34.2 % — AB (ref 36.0–46.0)
HEMOGLOBIN: 11.7 g/dL — AB (ref 12.0–15.0)
MCH: 30.3 pg (ref 26.0–34.0)
MCHC: 34.2 g/dL (ref 30.0–36.0)
MCV: 88.6 fL (ref 78.0–100.0)
Platelets: 318 10*3/uL (ref 150–400)
RBC: 3.86 MIL/uL — AB (ref 3.87–5.11)
RDW: 12.8 % (ref 11.5–15.5)
WBC: 18.4 10*3/uL — AB (ref 4.0–10.5)

## 2016-07-03 LAB — BASIC METABOLIC PANEL
ANION GAP: 7 (ref 5–15)
BUN: 15 mg/dL (ref 6–20)
CHLORIDE: 100 mmol/L — AB (ref 101–111)
CO2: 27 mmol/L (ref 22–32)
Calcium: 8.8 mg/dL — ABNORMAL LOW (ref 8.9–10.3)
Creatinine, Ser: 0.81 mg/dL (ref 0.44–1.00)
GFR calc non Af Amer: >60 mL/min (ref >60)
Glucose, Bld: 170 mg/dL — ABNORMAL HIGH (ref 65–99)
Potassium: 5.2 mmol/L — ABNORMAL HIGH (ref 3.5–5.1)
Sodium: 134 mmol/L — ABNORMAL LOW (ref 135–145)

## 2016-07-03 MED ORDER — SODIUM CHLORIDE 0.9 % IV SOLN
INTRAVENOUS | Status: DC
  Administered 2016-07-03 (×2): via INTRAVENOUS

## 2016-07-03 NOTE — Progress Notes (Signed)
Patient ID: Taylor [name], female   DOB: 12/01/1960, 56 y.o.   MRN: [id number] Subjective: 1 Day Post-Op Procedure(s) (LRB): TOTAL KNEE BILATERAL ARTHROPLASTY (Bilateral)    Patient reports pain as moderate.  Not much rest last night.  Hypotensive when trying to do some therapy yesterday.     Objective:   VITALS:   Vitals:   07/03/16 0457 07/03/16 0941  BP: 135/74 117/68  Pulse: 97 86  Resp: 16 18  Temp: 97.6 F (36.4 C) 98.1 F (36.7 C)    Neurovascular intact Incision: dressing C/D/I, bilaterally  LABS  Recent Labs  07/03/16 0453  HGB 11.7*  HCT 34.2*  WBC 18.4*  PLT 318     Recent Labs  07/03/16 0453  NA 134*  K 5.2*  BUN 15  CREATININE 0.81  GLUCOSE 170*    No results for input(s): LABPT, INR in the last 72 hours.   Assessment/Plan: 1 Day Post-Op Procedure(s) (LRB): TOTAL KNEE BILATERAL ARTHROPLASTY (Bilateral)   Advance diet Up with therapy Continue foley due to strict I&O, due to hypotensive episode will continue to use IV fluids and follow output Based on appearance of urine will send off UA Continue to try and progress with therapy Patient wants to try and go home tomorrow

## 2016-07-03 NOTE — Care Management Note (Signed)
Case Management Note  Patient Details  Name: Taylor [name] MRN: [id number] Date of Birth: 10/11/1960  Subjective/Objective:                  TOTAL KNEE BILATERAL ARTHROPLASTY (Bilateral) Action/Plan: Discharge planning Expected Discharge Date:  07/05/15               Expected Discharge Plan:  Home/Self Care  In-House Referral:     Discharge planning Services  CM Consult  Post Acute Care Choice:  NA Choice offered to:  Patient  DME Arranged:  N/A DME Agency:  NA  HH Arranged:  NA HH Agency:  Other - See comment  Status of Service:  Completed, signed off  If discussed at Long Length of Stay Meetings, dates discussed:    Additional Comments: Cm met with pt in room to offer choice of home health agency. Pt states MD giving her prescription for her to arrange her own arrangement with a friend who is a PT ath the [employer].  Pt states she has all DME needed at home.  NO other CM needs were communicated.  Jeffries, Sarah Christine, RN 07/03/2016, 11:12 AM  

## 2016-07-03 NOTE — Addendum Note (Signed)
Addendum  created 07/03/16 [id number] by Elyn PeersSandra J Analya Louissaint, CRNA   Charge Capture section accepted

## 2016-07-03 NOTE — Evaluation (Signed)
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  She will benefit from continued OT to further educate on bathroom transfers.  She will have help with ADLs as needed at home    Follow Up Recommendations  Supervision/Assistance - 24 hour    Equipment Recommendations  None recommended by OT    Recommendations for Other Services       Precautions / Restrictions Precautions Precautions: Fall;Knee Restrictions Weight Bearing Restrictions: No      Mobility Bed Mobility           Sit to supine: Min guard   General bed mobility comments: used bil UEs to assist legs as needed  Transfers Overall transfer level: Needs assistance               General transfer comment: NT assisted pt     Balance                                            ADL Overall ADL's : Needs assistance/impaired     Grooming: Set up;Sitting   Upper Body Bathing: Set up;Sitting   Lower Body Bathing: Minimal assistance;Sit to/from stand   Upper Body Dressing : Set up;Sitting   Lower Body Dressing: Moderate assistance;Sit to/from stand   Toilet Transfer: Minimal assistance;Stand-pivot;RW (to bed; cues to walk legs forward, reach for bed)   Toileting- Clothing Manipulation and Hygiene: Minimal assistance;Sit to/from stand         General ADL Comments: pt was finishing up ADL when OT arrived.  She will have help from husband as needed, and can reach to don pants. Able to get bil LEs onto bed with UEs assisting     Vision     Perception     Praxis      Pertinent Vitals/Pain Pain Score: 5  Pain Location: bil knees Pain Descriptors / Indicators: Sore Pain Intervention(s): Limited activity within patient's tolerance;Monitored during session;Premedicated  before session;Repositioned;Ice applied     Hand Dominance     Extremity/Trunk Assessment Upper Extremity Assessment Upper Extremity Assessment: Overall WFL for tasks assessed           Communication Communication Communication: No difficulties   Cognition Arousal/Alertness: Awake/alert Behavior During Therapy: WFL for tasks assessed/performed Overall Cognitive Status: Within Functional Limits for tasks assessed                     General Comments       Exercises       Shoulder Instructions      Home Living Family/patient expects to be discharged to:: Private residence Living Arrangements: Spouse/significant other Available Help at Discharge: Family;Friend(s)               Bathroom Shower/Tub: Producer, television/film/videoWalk-in shower   Bathroom Toilet: Standard     Home Equipment: Bedside commode;Crutches;Cane - single point;Walker - 2 wheels   Additional Comments: will stay on first level      Prior Functioning/Environment Level of Independence: Independent                 OT Problem List: Decreased strength;Decreased activity tolerance;Decreased knowledge of use of DME or AE;Pain   OT Treatment/Interventions:  Self-care/ADL training;DME and/or AE instruction;Patient/family education    OT Goals(Current goals can be found in the care plan section) Acute Rehab OT Goals Patient Stated Goal: to walk OT Goal Formulation: With patient Time For Goal Achievement: 07/10/16 Potential to Achieve Goals: Good ADL Goals Pt Will Transfer to Toilet: with min guard assist;ambulating;bedside commode Pt Will Perform Tub/Shower Transfer: Shower transfer;with min guard assist;ambulating;3 in 1  OT Frequency: Min 2X/week   Barriers to D/C:            Co-evaluation              End of Session    Activity Tolerance: Patient tolerated treatment well Patient left: in bed;with call bell/phone within reach;with family/visitor present   Time: 1134-1145 OT Time  Calculation (min): 11 min Charges:  OT General Charges $OT Visit: 1 Procedure OT Evaluation $OT Eval Low Complexity: 1 Procedure G-Codes:    Aesha Agrawal 07-27-16, 12:52 PM  Marica Otter, OTR/L [phone] 07/27/2016

## 2016-07-03 NOTE — Progress Notes (Signed)
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  Precautions  Precautions Fall;Knee  Pain Assessment  Pain Assessment 0-10  Pain Score 4  Pain Location thighs  Pain Descriptors / Indicators Sore  Pain Intervention(s) Limited activity within patient's tolerance;Monitored during session;Repositioned;Ice applied  Cognition  Arousal/Alertness Awake/alert  Behavior During Therapy WFL for tasks assessed/performed  Overall Cognitive Status Within Functional Limits for tasks assessed  Bed Mobility  Overal bed mobility Needs Assistance  Bed Mobility Supine to Sit;Sit to Supine  Supine to sit Min guard;HOB elevated  Sit to supine Min guard  General bed mobility comments self assisted LEs over EOB  Transfers  Overall transfer level Needs assistance  Equipment used Rolling walker (2 wheeled)  Transfers Sit to/from Stand  Sit to Stand Min guard  General transfer comment verbal cues for safe technique  Ambulation/Gait  Ambulation/Gait assistance Min guard  Ambulation Distance (Feet) 160 Feet  Assistive device Rolling walker (2 wheeled)  Gait Pattern/deviations Step-through pattern;Decreased stride length  General Gait Details verbal cues for sequencing, RW positioning, step length  PT - End of Session  Activity Tolerance Patient tolerated treatment well  Patient left in bed;with call bell/phone within reach;with bed alarm set  PT - Assessment/Plan  PT Plan Current plan remains appropriate  PT Frequency (ACUTE ONLY) 7X/week  Follow Up Recommendations Home health PT;Supervision/Assistance - 24 hour  PT equipment None recommended by PT  PT Goal Progression  Progress towards PT goals Progressing toward goals  PT Time Calculation  PT Start Time (ACUTE ONLY) 1451  PT  Stop Time (ACUTE ONLY) 1503  PT Time Calculation (min) (ACUTE ONLY) 12 min  PT General Charges  $$ ACUTE PT VISIT 1 Procedure  PT Treatments  $Gait Training 8-22 mins   Zenovia JarredKati Cayce Paschal, PT, DPT 07/03/2016 Pager: [phone]

## 2016-07-03 NOTE — Discharge Instructions (Addendum)
Information on my medicine - XARELTO (Rivaroxaban)  This medication education was reviewed with me or my healthcare representative as part of my discharge preparation.  The pharmacist that spoke with me during my hospital stay was:  [facility] Askar, Student-PharmD  Why was Xarelto prescribed for you? Xarelto was prescribed for you to reduce the risk of blood clots forming after orthopedic surgery. The medical term for these abnormal blood clots is venous thromboembolism (VTE).  What do you need to know about xarelto ? Take your Xarelto ONCE DAILY at the same time every day. You may take it either with or without food.  If you have difficulty swallowing the tablet whole, you may crush it and mix in applesauce just prior to taking your dose.  Take Xarelto exactly as prescribed by your doctor and DO NOT stop taking Xarelto without talking to the doctor who prescribed the medication.  Stopping without other VTE prevention medication to take the place of Xarelto may increase your risk of developing a clot.  After discharge, you should have regular check-up appointments with your healthcare provider that is prescribing your Xarelto.    What do you do if you miss a dose? If you miss a dose, take it as soon as you remember on the same day then continue your regularly scheduled once daily regimen the next day. Do not take two doses of Xarelto on the same day.   Important Safety Information A possible side effect of Xarelto is bleeding. You should call your healthcare provider right away if you experience any of the following: ? Bleeding from an injury or your nose that does not stop. ? Unusual colored urine (red or dark brown) or unusual colored stools (red or black). ? Unusual bruising for unknown reasons. ? A serious fall or if you hit your head (even if there is no bleeding).  Some medicines may interact with Xarelto and might increase your risk of bleeding while on Xarelto. To help  avoid this, consult your healthcare provider or pharmacist prior to using any new prescription or non-prescription medications, including herbals, vitamins, non-steroidal anti-inflammatory drugs (NSAIDs) and supplements.  This website has more information on Xarelto: VisitDestination.com.br.   ____________________________________________________________________________________________________________    INSTRUCTIONS AFTER JOINT REPLACEMENT   o Remove items at home which could result in a fall. This includes throw rugs or furniture in walking pathways o ICE to the affected joint every three hours while awake for 30 minutes at a time, for at least the first 3-5 days, and then as needed for pain and swelling.  Continue to use ice for pain and swelling. You may notice swelling that will progress down to the foot and ankle.  This is normal after surgery.  Elevate your leg when you are not up walking on it.   o Continue to use the breathing machine you got in the hospital (incentive spirometer) which will help keep your temperature down.  It is common for your temperature to cycle up and down following surgery, especially at night when you are not up moving around and exerting yourself.  The breathing machine keeps your lungs expanded and your temperature down.   DIET:  As you were doing prior to hospitalization, we recommend a well-balanced diet.  DRESSING / WOUND CARE / SHOWERING  Keep the surgical dressing until follow up.  The dressing is water proof, so you can shower without any extra covering.  IF THE DRESSING FALLS OFF or the wound gets wet inside, change the dressing  with sterile gauze.  Please use good hand washing techniques before changing the dressing.  Do not use any lotions or creams on the incision until instructed by your surgeon.    ACTIVITY  o Increase activity slowly as tolerated, but follow the weight bearing instructions below.   o No driving for 6 weeks or until further direction  given by your physician.  You cannot drive while taking narcotics.  o No lifting or carrying greater than 10 lbs. until further directed by your surgeon. o Avoid periods of inactivity such as sitting longer than an hour when not asleep. This helps prevent blood clots.  o You may return to work once you are authorized by your doctor.     WEIGHT BEARING   Weight bearing as tolerated with assist device (walker, cane, etc) as directed, use it as long as suggested by your surgeon or therapist, typically at least 4-6 weeks.   EXERCISES  Results after joint replacement surgery are often greatly improved when you follow the exercise, range of motion and muscle strengthening exercises prescribed by your doctor. Safety measures are also important to protect the joint from further injury. Any time any of these exercises cause you to have increased pain or swelling, decrease what you are doing until you are comfortable again and then slowly increase them. If you have problems or questions, call your caregiver or physical therapist for advice.   Rehabilitation is important following a joint replacement. After just a few days of immobilization, the muscles of the leg can become weakened and shrink (atrophy).  These exercises are designed to build up the tone and strength of the thigh and leg muscles and to improve motion. Often times heat used for twenty to thirty minutes before working out will loosen up your tissues and help with improving the range of motion but do not use heat for the first two weeks following surgery (sometimes heat can increase post-operative swelling).   These exercises can be done on a training (exercise) mat, on the floor, on a table or on a bed. Use whatever works the best and is most comfortable for you.    Use music or television while you are exercising so that the exercises are a pleasant break in your day. This will make your life better with the exercises acting as a break in  your routine that you can look forward to.   Perform all exercises about fifteen times, three times per day or as directed.  You should exercise both the operative leg and the other leg as well.  Exercises include:    Quad Sets - Tighten up the muscle on the front of the thigh (Quad) and hold for 5-10 seconds.    Straight Leg Raises - With your knee straight (if you were given a brace, keep it on), lift the leg to 60 degrees, hold for 3 seconds, and slowly lower the leg.  Perform this exercise against resistance later as your leg gets stronger.   Leg Slides: Lying on your back, slowly slide your foot toward your buttocks, bending your knee up off the floor (only go as far as is comfortable). Then slowly slide your foot back down until your leg is flat on the floor again.   Angel Wings: Lying on your back spread your legs to the side as far apart as you can without causing discomfort.   Hamstring Strength:  Lying on your back, push your heel against the floor with your leg straight  by tightening up the muscles of your buttocks.  Repeat, but this time bend your knee to a comfortable angle, and push your heel against the floor.  You may put a pillow under the heel to make it more comfortable if necessary.   A rehabilitation program following joint replacement surgery can speed recovery and prevent re-injury in the future due to weakened muscles. Contact your doctor or a physical therapist for more information on knee rehabilitation.    CONSTIPATION  Constipation is defined medically as fewer than three stools per week and severe constipation as less than one stool per week.  Even if you have a regular bowel pattern at home, your normal regimen is likely to be disrupted due to multiple reasons following surgery.  Combination of anesthesia, postoperative narcotics, change in appetite and fluid intake all can affect your bowels.   YOU MUST use at least one of the following options; they are listed in  order of increasing strength to get the job done.  They are all available over the counter, and you may need to use some, POSSIBLY even all of these options:    Drink plenty of fluids (prune juice may be helpful) and high fiber foods Colace 100 mg by mouth twice a day  Senokot for constipation as directed and as needed Dulcolax (bisacodyl), take with full glass of water  Miralax (polyethylene glycol) once or twice a day as needed.  If you have tried all these things and are unable to have a bowel movement in the first 3-4 days after surgery call either your surgeon or your primary doctor.    If you experience loose stools or diarrhea, hold the medications until you stool forms back up.  If your symptoms do not get better within 1 week or if they get worse, check with your doctor.  If you experience "the worst abdominal pain ever" or develop nausea or vomiting, please contact the office immediately for further recommendations for treatment.   ITCHING:  If you experience itching with your medications, try taking only a single pain pill, or even half a pain pill at a time.  You can also use Benadryl over the counter for itching or also to help with sleep.   TED HOSE STOCKINGS:  Use stockings on both legs until for at least 2 weeks or as directed by physician office. They may be removed at night for sleeping.  MEDICATIONS:  See your medication summary on the After Visit Summary that nursing will review with you.  You may have some home medications which will be placed on hold until you complete the course of blood thinner medication.  It is important for you to complete the blood thinner medication as prescribed.  PRECAUTIONS:  If you experience chest pain or shortness of breath - call 911 immediately for transfer to the hospital emergency department.   If you develop a fever greater that 101 F, purulent drainage from wound, increased redness or drainage from wound, foul odor from the  wound/dressing, or calf pain - CONTACT YOUR SURGEON.                                                   FOLLOW-UP APPOINTMENTS:  If you do not already have a post-op appointment, please call the office for an appointment to be seen by your surgeon.  Guidelines for how soon to be seen are listed in your “After Visit Summary”, but are typically between 1-4 weeks after surgery. ° °OTHER INSTRUCTIONS:  ° °Knee Replacement:  Do not place pillow under knee, focus on keeping the knee straight while resting.  ° °MAKE SURE YOU:  °• Understand these instructions.  °• Get help right away if you are not doing well or get worse.  ° ° °Thank you for letting us be a part of your medical care team.  It is a privilege we respect greatly.  We hope these instructions will help you stay on track for a fast and full recovery!  °  °

## 2016-07-03 NOTE — Progress Notes (Signed)
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  Pt performed LE exercises as well.    Follow Up Recommendations  Home health PT;Supervision/Assistance - 24 hour     Equipment Recommendations  None recommended by PT    Recommendations for Other Services       Precautions / Restrictions Precautions Precautions: Fall;Knee Restrictions Weight Bearing Restrictions: No    Mobility  Bed Mobility Overal bed mobility: Needs Assistance Bed Mobility: Supine to Sit     Supine to sit: Min guard;HOB elevated Sit to supine: Min guard   General bed mobility comments: self assisted LEs over EOB  Transfers Overall transfer level: Needs assistance Equipment used: Rolling walker (2 wheeled) Transfers: Sit to/from Stand Sit to Stand: Min assist         General transfer comment: slight assist to steady with rise, verbal cues for safe technique  Ambulation/Gait Ambulation/Gait assistance: Min guard Ambulation Distance (Feet): 40 Feet Assistive device: Rolling walker (2 wheeled) Gait Pattern/deviations: Step-through pattern;Decreased stride length     General Gait Details: verbal cues for sequencing, RW positioning, step length   Stairs            Wheelchair Mobility    Modified Rankin (Stroke Patients Only)       Balance                                    Cognition Arousal/Alertness: Awake/alert Behavior During Therapy: WFL for tasks assessed/performed Overall Cognitive Status: Within Functional Limits for tasks assessed                      Exercises Total Joint Exercises Ankle Circles/Pumps: AROM;Both;10 reps Quad Sets: AROM;Both;10 reps Short Arc Quad: AROM;Both;10 reps Heel Slides: Seated;AAROM;Both;10  reps Goniometric ROM: able to achieve at least 90* AAROM bilaterally knee flexion, L more limited then R by observation    General Comments        Pertinent Vitals/Pain Pain Assessment: 0-10 Pain Score: 5  Pain Location: thighs Pain Descriptors / Indicators: Sore Pain Intervention(s): Limited activity within patient's tolerance;Monitored during session;Repositioned;Premedicated before session    Home Living Family/patient expects to be discharged to:: Private residence Living Arrangements: Spouse/significant other Available Help at Discharge: Family;Friend(s)         Home Equipment: Bedside commode;Crutches;Cane - single point;Walker - 2 wheels Additional Comments: will stay on first level    Prior Function Level of Independence: Independent          PT Goals (current goals can now be found in the care plan section) Acute Rehab PT Goals Patient Stated Goal: to walk Progress towards PT goals: Progressing toward goals    Frequency    7X/week      PT Plan Current plan remains appropriate    Co-evaluation             End of Session Equipment Utilized During Treatment: Gait belt Activity Tolerance: Patient tolerated treatment well Patient left: in chair;with call bell/phone within reach     Time: 1015-1041 PT Time Calculation (min) (ACUTE ONLY): 26 min  Charges:  $Gait Training: 8-22 mins $Therapeutic Exercise: 8-22 mins  G Codes:      Taylor [name],Taylor [name] 31, 2018, 1:01 PM Zenovia Jarred, PT, DPT 31-[name]-2018 Pager: [phone]

## 2016-07-04 LAB — BASIC METABOLIC PANEL
Anion gap: 6 (ref 5–15)
BUN: 13 mg/dL (ref 6–20)
CHLORIDE: 101 mmol/L (ref 101–111)
CO2: 28 mmol/L (ref 22–32)
Calcium: 8.5 mg/dL — ABNORMAL LOW (ref 8.9–10.3)
Creatinine, Ser: 0.82 mg/dL (ref 0.44–1.00)
GFR calc Af Amer: >60 mL/min (ref >60)
GFR calc non Af Amer: >60 mL/min (ref >60)
Glucose, Bld: 181 mg/dL — ABNORMAL HIGH (ref 65–99)
POTASSIUM: 3.5 mmol/L (ref 3.5–5.1)
SODIUM: 135 mmol/L (ref 135–145)

## 2016-07-04 LAB — CBC
HCT: 28.8 % — ABNORMAL LOW (ref 36.0–46.0)
HEMOGLOBIN: 9.7 g/dL — AB (ref 12.0–15.0)
MCH: 30.1 pg (ref 26.0–34.0)
MCHC: 33.7 g/dL (ref 30.0–36.0)
MCV: 89.4 fL (ref 78.0–100.0)
Platelets: 280 10*3/uL (ref 150–400)
RBC: 3.22 MIL/uL — AB (ref 3.87–5.11)
RDW: 12.9 % (ref 11.5–15.5)
WBC: 14.8 10*3/uL — ABNORMAL HIGH (ref 4.0–10.5)

## 2016-07-04 MED ORDER — OXYCODONE HCL 5 MG PO TABS: 5.0000 mg | ORAL_TABLET | ORAL | 0 refills | Status: DC

## 2016-07-04 MED ORDER — RIVAROXABAN 10 MG PO TABS: 10.0000 mg | ORAL_TABLET | ORAL | 0 refills | Status: DC

## 2016-07-04 MED ORDER — DOCUSATE SODIUM 100 MG PO CAPS: 100.0000 mg | ORAL_CAPSULE | Freq: Two times a day (BID) | ORAL | 0 refills | Status: AC

## 2016-07-04 MED ORDER — ASPIRIN 81 MG PO CHEW: 81.0000 mg | CHEWABLE_TABLET | Freq: Two times a day (BID) | ORAL | 0 refills | Status: AC

## 2016-07-04 MED ORDER — POLYETHYLENE GLYCOL 3350 17 G PO PACK: 17.0000 g | PACK | Freq: Two times a day (BID) | ORAL | 0 refills | Status: AC

## 2016-07-04 MED ORDER — FERROUS SULFATE 325 (65 FE) MG PO TABS: 325.0000 mg | ORAL_TABLET | Freq: Three times a day (TID) | ORAL | 3 refills | Status: AC

## 2016-07-04 NOTE — Progress Notes (Signed)
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  Pt feels ready for d/c home today.  Pt and spouse had no further questions.  Follow Up Recommendations  Home health PT;Supervision/Assistance - 24 hour     Equipment Recommendations  None recommended by PT    Recommendations for Other Services       Precautions / Restrictions Precautions Precautions: Fall;Knee    Mobility  Bed Mobility Overal bed mobility: Needs Assistance Bed Mobility: Supine to Sit     Supine to sit: HOB elevated;Supervision Sit to supine: Supervision   General bed mobility comments: self assisted LEs   Transfers Overall transfer level: Needs assistance Equipment used: Rolling walker (2 wheeled) Transfers: Sit to/from Stand Sit to Stand: Supervision         General transfer comment: verbal cues for safe technique  Ambulation/Gait Ambulation/Gait assistance: Min guard;Supervision Ambulation Distance (Feet): 80 Feet Assistive device: Rolling walker (2 wheeled) Gait Pattern/deviations: Step-through pattern;Decreased stride length     General Gait Details: verbal cues for sequencing, RW positioning, step length   Stairs Stairs: Yes   Stair Management: Step to pattern;Backwards;With walker Number of Stairs: 2 General stair comments: verbal cues for safety, sequence, technique; spouse present, educated, and assisted with RW  Wheelchair Mobility    Modified Rankin (Stroke Patients Only)       Balance                                    Cognition Arousal/Alertness: Awake/alert Behavior During Therapy: WFL for tasks assessed/performed Overall Cognitive Status: Within Functional Limits for tasks assessed                      Exercises Total  Joint Exercises Heel Slides: Seated;AAROM;Both;10 reps    General Comments        Pertinent Vitals/Pain Pain Assessment: 0-10 Pain Score: 5  Pain Location: bil knees/thighs Pain Descriptors / Indicators: Aching;Sore Pain Intervention(s): Limited activity within patient's tolerance;Monitored during session;Ice applied    Home Living                      Prior Function            PT Goals (current goals can now be found in the care plan section) Progress towards PT goals: Progressing toward goals    Frequency    7X/week      PT Plan Current plan remains appropriate    Co-evaluation             End of Session Equipment Utilized During Treatment: Gait belt Activity Tolerance: Patient tolerated treatment well Patient left: with call bell/phone within reach;in chair;with family/visitor present     Time: 1420-1440 PT Time Calculation (min) (ACUTE ONLY): 20 min  Charges:  $Gait Training: 8-22 mins                    G Codes:      Alessandria Henken,KATHrine E 07/04/2016, 3:51 PM Zenovia JarredKati Tamirra Sienkiewicz, PT, DPT 07/04/2016 Pager: [phone]

## 2016-07-04 NOTE — Progress Notes (Signed)
Physical Therapy Treatment Patient Details Name: Taylor DailyRebecca Toledo MRN: [id number] DOB: 04/07/1961 Today's Date: 07/04/2016    History of Present Illness Bilateral TKA 07/02/16    PT Comments    Pt ambulated in hallway and practiced stairs.  Pt would like to practice steps again with spouse present this afternoon.  Follow Up Recommendations  Home health PT;Supervision/Assistance - 24 hour     Equipment Recommendations  None recommended by PT    Recommendations for Other Services       Precautions / Restrictions Precautions Precautions: Fall;Knee    Mobility  Bed Mobility Overal bed mobility: Needs Assistance Bed Mobility: Supine to Sit;Sit to Supine     Supine to sit: HOB elevated;Supervision Sit to supine: Supervision   General bed mobility comments: self assisted LEs   Transfers Overall transfer level: Needs assistance Equipment used: Rolling walker (2 wheeled) Transfers: Sit to/from Stand Sit to Stand: Min guard         General transfer comment: verbal cues for safe technique  Ambulation/Gait Ambulation/Gait assistance: Min guard Ambulation Distance (Feet): 140 Feet Assistive device: Rolling walker (2 wheeled) Gait Pattern/deviations: Step-through pattern;Decreased stride length     General Gait Details: verbal cues for sequencing, RW positioning, step length   Stairs Stairs: Yes   Stair Management: Step to pattern;Backwards;With walker Number of Stairs: 2 General stair comments: verbal cues for safety, sequence, technique, assist to steady upon descending due to LOB  Wheelchair Mobility    Modified Rankin (Stroke Patients Only)       Balance                                    Cognition Arousal/Alertness: Awake/alert Behavior During Therapy: WFL for tasks assessed/performed Overall Cognitive Status: Within Functional Limits for tasks assessed                      Exercises      General Comments         Pertinent Vitals/Pain Pain Assessment: 0-10 Pain Score: 7  Pain Location: bil knees/thighs Pain Descriptors / Indicators: Aching;Sore Pain Intervention(s): Limited activity within patient's tolerance;Monitored during session;Repositioned (states she just received pain meds prior to session)    Home Living                      Prior Function            PT Goals (current goals can now be found in the care plan section) Progress towards PT goals: Progressing toward goals    Frequency    7X/week      PT Plan Current plan remains appropriate    Co-evaluation             End of Session Equipment Utilized During Treatment: Gait belt Activity Tolerance: Patient tolerated treatment well Patient left: in bed;with call bell/phone within reach     Time: 1040-1054 PT Time Calculation (min) (ACUTE ONLY): 14 min  Charges:  $Gait Training: 8-22 mins                    G Codes:      Starsha Morning,KATHrine E 07/04/2016, 12:55 PM Zenovia JarredKati Danene Montijo, PT, DPT 07/04/2016 Pager: [phone]

## 2016-07-04 NOTE — Progress Notes (Signed)
Occupational Therapy Treatment Patient Details Name: Taylor [name] MRN: [id number] DOB: 02/15/1961 Today's Date: 07/04/2016    History of present illness Bilateral TKA 07/02/16   OT comments  All education completed  Follow Up Recommendations  Supervision/Assistance - 24 hour    Equipment Recommendations  None recommended by OT    Recommendations for Other Services      Precautions / Restrictions Precautions Precautions: Fall;Knee Restrictions Weight Bearing Restrictions: No       Mobility Bed Mobility         Supine to sit: Supervision Sit to supine: Supervision      Transfers   Equipment used: Rolling walker (2 wheeled)   Sit to Stand: Min guard         General transfer comment: cues to walk legs forward when sitting    Balance                                   ADL                           Toilet Transfer: Min guard;RW;Ambulation (simulated:  bed)       Tub/ Shower Transfer: Walk-in shower;Min guard;Ambulation;3 in 1     General ADL Comments: performed shower transfer:  pt has catheter in place:  did not sit on commode      Vision                     Perception     Praxis      Cognition   Behavior During Therapy: [facility] for tasks assessed/performed Overall Cognitive Status: Within Functional Limits for tasks assessed                       Extremity/Trunk Assessment               Exercises     Shoulder Instructions       General Comments      Pertinent Vitals/ Pain       Pain Score: 5  Pain Location: bil knees/thighs Pain Descriptors / Indicators: Sore Pain Intervention(s): Limited activity within patient's tolerance;Monitored during session;Premedicated before session;Repositioned  Home Living                                          Prior Functioning/Environment              Frequency           Progress Toward Goals  OT Goals(current goals  can now be found in the care plan section)  Progress towards OT goals: Goals met/education completed, patient discharged from [facility]                 End of Session     Activity Tolerance Patient tolerated treatment well   Patient Left in bed;with call bell/phone within reach   Nurse Communication          Time: 4098-1191 OT Time Calculation (min): 13 min  Charges: OT General Charges $OT Visit: 1 Procedure OT Treatments $Self Care/Home Management : 8-22 mins  Hollye Pritt 07/04/2016, 8:58 AM Lesle Chris, OTR/L [phone] 07/04/2016

## 2016-07-04 NOTE — Progress Notes (Signed)
     Subjective: 2 Days Post-Op Procedure(s) (LRB): TOTAL KNEE BILATERAL ARTHROPLASTY (Bilateral)   Patient reports pain as mild / none, pain controlled well.  No events throughout the night.  She is very happy with how she is progressing at this point. Discussed the procedure and expectations.   Ready to be discharged home.  Objective:   VITALS:   Vitals:   07/04/16 0129 07/04/16 0508  BP: (!) 143/81 105/71  Pulse: 99 (!) 103  Resp: 14 14  Temp: 98.4 F (36.9 C) 98.6 F (37 C)    Bilaterally: Dorsiflexion/Plantar flexion intact Incision: dressing C/D/I No cellulitis present Compartment soft  LABS  Recent Labs  07/03/16 0453 07/04/16 0509  HGB 11.7* 9.7*  HCT 34.2* 28.8*  WBC 18.4* 14.8*  PLT 318 280     Recent Labs  07/03/16 0453 07/04/16 0509  NA 134* 135  K 5.2* 3.5  BUN 15 13  CREATININE 0.81 0.82  GLUCOSE 170* 181*     Assessment/Plan: 2 Days Post-Op Procedure(s) (LRB): TOTAL KNEE BILATERAL ARTHROPLASTY (Bilateral) Up with therapy Discharge home [facility]. Follow up with OLIN,Caetano Oberhaus D in 2 weeks.  Contact information:  [facility] [address] [place] [id number] [phone]    Obese (BMI 30-39.9) Estimated body mass index is 31.72 kg/m as calculated from the following:   Height as of this encounter: 5\' 7"  (1.702 m).   Weight as of this encounter: 91.9 kg (202 lb 8 oz). Patient also counseled that weight may inhibit the healing process Patient counseled that losing weight will help with future health issues        Anastasio AuerbachMatthew S. Marcelle Hepner   PAC  07/04/2016, 8:54 AM

## 2016-07-09 NOTE — Discharge Summary (Signed)
Physician Discharge Summary  Patient ID: Brandii Lakey MRN: [id number] DOB/AGE: [birth year] 56 y.o.  Admit date: 07/02/2016 Discharge date: 07/04/2016   Procedures:  Procedure(s) (LRB): TOTAL KNEE BILATERAL ARTHROPLASTY (Bilateral)  Attending Physician:  Dr. Durene Romans   Admission Diagnoses:   Bilateral knee primary OA / pain  Discharge Diagnoses:  Principal Problem:   S/P bilateral TKAs Active Problems:   Obese  Past Medical History:  Diagnosis Date  . ADD (attention deficit disorder)   . Arthritis    Osteoarthritis  . Complication of anesthesia   . Depression   . Eczema    elbows  . Hypertension   . PONV (postoperative nausea and vomiting)    general anesthesia only, not with spinal    HPI:    Taylor [name], 56 y.o. female, has a history of pain and functional disability in the bilateral knees due to arthritis and has failed non-surgical conservative treatments for greater than 12 weeks to include NSAID's and/or analgesics, corticosteriod injections, viscosupplementation injections, use of assistive devices and activity modification.  Onset of symptoms was gradual, starting >10 years ago with gradually worsening course since that time. The patient noted prior procedures on the knee to include  arthroscopy and menisectomy on the right knee.  Patient currently rates pain in the bilateral knees at 8 out of 10 with activity. Patient has night pain, worsening of pain with activity and weight bearing, pain that interferes with activities of daily living, pain with passive range of motion, crepitus and joint swelling.  Patient has evidence of periarticular osteophytes and joint space narrowing by imaging studies.  There is no active infection.  Risks, benefits and expectations were discussed with the patient.  Risks including but not limited to the risk of anesthesia, blood clots, nerve damage, blood vessel damage, failure of the prosthesis, infection and up to and including death.   Patient understand the risks, benefits and expectations and wishes to proceed with surgery.  PCP: Ethel Rana   Discharged Condition: good  Hospital Course:  Patient underwent the above stated procedure on 07/02/2016. Patient tolerated the procedure well and brought to the recovery room in good condition and subsequently to the floor.  POD #1 BP: 117/68 ; Pulse: 86 ; Temp: 98.1 F (36.7 C) ; Resp: 18 Patient reports pain as moderate.  Not much rest last night.  Hypotensive when trying to do some therapy yesterday.  Neurovascular intact and incision: dressing C/D/I, bilaterally.  LABS  Basename    HGB     11.7  HCT     34.2   POD #2  BP: 105/71 ; Pulse: 103 ; Temp: 98.6 F (37 C) ; Resp: 14 Patient reports pain as mild / none, pain controlled well.  No events throughout the night.  She is very happy with how she is progressing at this point. Discussed the procedure and expectations.   Ready to be discharged home. Bilaterally: Dorsiflexion/plantar flexion intact, incision: dressing C/D/I, no cellulitis present and compartment soft.   LABS  Basename    HGB     9.7  HCT     28.8     Discharge Exam: General appearance: alert, cooperative and no distress Extremities: Homans sign is negative, no sign of DVT, no edema, redness or tenderness in the calves or thighs and no ulcers, gangrene or trophic changes  Disposition: Home with follow up in 2 weeks   Follow-up Information    Shelda Pal, MD. Schedule an appointment as soon as possible for  a visit in 2 week(s).   Specialty:  Orthopedic Surgery Contact information: [address] [place] [id number] [phone]           Discharge Instructions    Call MD / Call 911    Complete by:  As directed    If you experience chest pain or shortness of breath, CALL 911 and be transported to the hospital emergency room.  If you develope a fever above 101 F, pus (white drainage) or increased drainage or  redness at the wound, or calf pain, call your surgeon's office.   Change dressing    Complete by:  As directed    Maintain surgical dressing until follow up in the clinic. If the edges start to pull up, may reinforce with tape. If the dressing is no longer working, may remove and cover with gauze and tape, but must keep the area dry and clean.  Call with any questions or concerns.   Constipation Prevention    Complete by:  As directed    Drink plenty of fluids.  Prune juice may be helpful.  You may use a stool softener, such as Colace (over the counter) 100 mg twice a day.  Use MiraLax (over the counter) for constipation as needed.   Diet - low sodium heart healthy    Complete by:  As directed    Discharge instructions    Complete by:  As directed    Maintain surgical dressing until follow up in the clinic. If the edges start to pull up, may reinforce with tape. If the dressing is no longer working, may remove and cover with gauze and tape, but must keep the area dry and clean.  [facility]. Call with any questions or concerns.   Increase activity slowly as tolerated    Complete by:  As directed    Weight bearing as tolerated with assist device (walker, cane, etc) as directed, use it as long as suggested by your surgeon or therapist, typically at least 4-6 weeks.   TED hose    Complete by:  As directed    Use stockings (TED hose) for 2 weeks on both leg(s).  You may remove them at night for sleeping.      Allergies as of 07/04/2016   No Known Allergies     Medication List    STOP taking these medications   traMADol 50 MG tablet Commonly known as:  ULTRAM     TAKE these medications   amphetamine-dextroamphetamine 20 MG 24 hr capsule Commonly known as:  ADDERALL XR Take 20 mg by mouth 2 (two) times daily.   aspirin 81 MG chewable tablet Chew 1 tablet (81 mg total) by mouth 2 (two) times daily. Start the day after finishing the Xarelto. Start  taking on:  07/19/2016   buPROPion 300 MG 24 hr tablet Commonly known as:  WELLBUTRIN XL Take 300 mg by mouth daily.   clonazePAM 1 MG tablet Commonly known as:  KLONOPIN Take 1 mg by mouth as needed for anxiety.   docusate sodium 100 MG capsule Commonly known as:  COLACE Take 1 capsule (100 mg total) by mouth 2 (two) times daily.   ferrous sulfate 325 (65 FE) MG tablet Take 1 tablet (325 mg total) by mouth 3 (three) times daily after meals.   hydrochlorothiazide 25 MG tablet Commonly known as:  HYDRODIURIL Take 25 mg by mouth every morning.   methocarbamol 500 MG tablet Commonly  known as:  ROBAXIN Take 1 tablet (500 mg total) by mouth every 6 (six) hours as needed for muscle spasms.   olmesartan 20 MG tablet Commonly known as:  BENICAR Take 20 mg by mouth every morning.   oxyCODONE 5 MG immediate release tablet Commonly known as:  Oxy IR/ROXICODONE Take 1-3 tablets (5-15 mg total) by mouth every 4 (four) hours. What changed:  when to take this  reasons to take this   polyethylene glycol packet Commonly known as:  MIRALAX / GLYCOLAX Take 17 g by mouth 2 (two) times daily.   rivaroxaban 10 MG Tabs tablet Commonly known as:  XARELTO Take 1 tablet (10 mg total) by mouth daily. Take for 14 days.  Start aspirin the day after finishing the Xarelto.   sertraline 50 MG tablet Commonly known as:  ZOLOFT Take 25 mg by mouth every morning.   tiZANidine 4 MG tablet Commonly known as:  ZANAFLEX Take 4 mg by mouth every 8 (eight) hours as needed for muscle spasms.   vitamin B-12 1000 MCG tablet Commonly known as:  CYANOCOBALAMIN Take 1,000 mcg by mouth daily.   Vitamin D3 10000 units Tabs Take 10,000 Units by mouth daily.        Signed: Anastasio Auerbach. Quirino Kakos   PA-C  07/09/2016, 9:47 AM

## 2016-07-12 ENCOUNTER — Other Ambulatory Visit: Payer: Self-pay | Admitting: *Deleted

## 2016-07-12 NOTE — Patient Outreach (Signed)
[facility]) Care Management  07/12/2016  Gwenith DailyRebecca Kirkeby 03/28/1961 [id number]  Subjective: Telephone call to patient's home / mobile number, no answer, left HIPAA compliant voicemail message, and requested call back.  Objective:  Per chart, patient hospitalized  07/02/16 - 07/04/16 for bilateral knee osteoarthritis.   Status post Right total knee replacement and Left total knee replacement on 07/02/16.   Patient also has a history of hypertension.  Per [employer]  i[employer]aborate patient hospitalized  07/12/16 - 07/11/16 (may be authorization timeframe versus hospitalization dates of service).   Assessment: Received [employer] Transition of care referral on 07/12/16.   Transition of care follow up pending patient contact.   Plan: RNCM will call patient for 2nd telephone outreach attempt, within 10 business days, if no return call.   Saket Hellstrom H. Gardiner Barefootooper RN, BSN, CCM [facility] Care Management [facility] Telephonic CM Phone: [phone] Fax: [phone]

## 2016-07-13 ENCOUNTER — Ambulatory Visit: Payer: Managed Care, Other (non HMO) | Admitting: *Deleted

## 2016-07-13 ENCOUNTER — Other Ambulatory Visit: Payer: Self-pay | Admitting: *Deleted

## 2016-07-13 NOTE — Patient Outreach (Signed)
[facility]) Care Management  07/13/2016  Taylor DailyRebecca [name] 10/12/1960 [id number]   Subjective: Telephone call to patient's home / mobile number, no answer, left HIPAA compliant voicemail message, and requested call back.  Objective:  Per chart, patient hospitalized  07/02/16 - 07/04/16 for bilateralknee osteoarthritis.   Status post Righttotal knee replacement and Lefttotal knee replacement on 07/02/16.   Patient also has a history of hypertension.  Per Rosann AuerbachC[employer]  i[employer]aborate patient hospitalized  07/12/16 - 07/11/16 (may be authorization timeframe versus hospitalization dates of service).   Assessment: Received [employer] Transition of care referral on 07/12/16.   Transition of care follow up pending patient contact.   Plan: RNCM will call patient for 3rd telephone outreach attempt, within 10 business days, if no return call.   Alwin Lanigan H. Gardiner Barefootooper RN, BSN, CCM [facility] Care Management [facility] Telephonic CM Phone: [phone] Fax: [phone]

## 2016-07-16 ENCOUNTER — Encounter: Payer: Self-pay | Admitting: *Deleted

## 2016-07-16 ENCOUNTER — Other Ambulatory Visit: Payer: Self-pay | Admitting: *Deleted

## 2016-07-16 ENCOUNTER — Ambulatory Visit: Payer: Self-pay | Admitting: *Deleted

## 2016-07-16 NOTE — Patient Outreach (Signed)
[facility]) Care Management  07/16/2016  Taylor DailyRebecca [name] 01/26/1961 [id number]  Subjective: Telephone call to patient's home / mobile number, no answer, left HIPAA compliant voicemail message, and requested call back.  Objective: Per chart, patient hospitalized 07/02/16 - 07/04/16 for bilateralknee osteoarthritis. Status post Righttotal knee replacement and Lefttotal knee replacement on 07/02/16. Patient also has a history of hypertension. Per Rosann AuerbachC[employer] i[employer]aborate patient hospitalized 07/12/16 - 07/11/16 (may be authorization timeframe versus hospitalization dates of service).   Assessment: Received [employer] Transition of care referral on 07/12/16. Transition of care follow up pending patient contact.   Plan: RNCM will send patient unsuccessful outreach letter,  [facility] pamph[facility], and proceed with case closure within 10 business days, if no return call.   Rajohn Henery H. Gardiner Barefootooper RN, BSN, CCM [facility] Care Management [facility] Telephonic CM Phone: [phone] Fax: [phone]

## 2016-07-30 ENCOUNTER — Other Ambulatory Visit: Payer: Self-pay | Admitting: *Deleted

## 2016-07-30 NOTE — Patient Outreach (Signed)
[facility]) Care Management  07/30/2016  Taylor DailyRebecca [name] 06/18/1961 [id number]   No response from patient outreach attempts, will proceed with case closure.   Objective: Per chart, patient hospitalized 07/02/16 - 07/04/16 for bilateralknee osteoarthritis. Status post Righttotal knee replacement and Lefttotal knee replacement on 07/02/16. Patient also has a history of hypertension. Per Rosann AuerbachC[employer] i[employer]aborate patient hospitalized 07/12/16 - 07/11/16 (may be authorization timeframe versus hospitalization dates of service).   Assessment: Received [employer] Transition of care referral on 07/12/16. Transition of care follow up not completed due to patient unable to contact and will proceed with case closure.    Plan: RNCM will send case closure due to unable to contact request to Iverson AlaminLaura Greeson at [facility].    [name] H. Gardiner Barefootooper RN, BSN, CCM [facility] Care Management [facility] Telephonic CM Phone: [phone] Fax: [phone]

## 2019-07-15 ENCOUNTER — Emergency Department (HOSPITAL_COMMUNITY)
Admission: EM | Admit: 2019-07-15 | Discharge: 2019-07-15 | Disposition: A | Discharge status: Home / Self Care | Payer: BC Managed Care – PPO | Attending: Emergency Medicine | Admitting: Emergency Medicine

## 2019-07-15 ENCOUNTER — Emergency Department (HOSPITAL_COMMUNITY): Payer: BC Managed Care – PPO

## 2019-07-15 ENCOUNTER — Other Ambulatory Visit: Payer: Self-pay

## 2019-07-15 ENCOUNTER — Encounter (HOSPITAL_COMMUNITY): Payer: Self-pay | Admitting: Emergency Medicine

## 2019-07-15 DIAGNOSIS — Y929 Unspecified place or not applicable: Secondary | ICD-10-CM | POA: Diagnosis not present

## 2019-07-15 DIAGNOSIS — Y9301 Activity, walking, marching and hiking: Secondary | ICD-10-CM | POA: Diagnosis not present

## 2019-07-15 DIAGNOSIS — S99911A Unspecified injury of right ankle, initial encounter: Secondary | ICD-10-CM | POA: Diagnosis present

## 2019-07-15 DIAGNOSIS — I10 Essential (primary) hypertension: Secondary | ICD-10-CM | POA: Insufficient documentation

## 2019-07-15 DIAGNOSIS — Z96653 Presence of artificial knee joint, bilateral: Secondary | ICD-10-CM | POA: Diagnosis not present

## 2019-07-15 DIAGNOSIS — W109XXA Fall (on) (from) unspecified stairs and steps, initial encounter: Secondary | ICD-10-CM | POA: Insufficient documentation

## 2019-07-15 DIAGNOSIS — S82391A Other fracture of lower end of right tibia, initial encounter for closed fracture: Secondary | ICD-10-CM | POA: Insufficient documentation

## 2019-07-15 DIAGNOSIS — Z79899 Other long term (current) drug therapy: Secondary | ICD-10-CM | POA: Insufficient documentation

## 2019-07-15 DIAGNOSIS — S9304XA Dislocation of right ankle joint, initial encounter: Secondary | ICD-10-CM | POA: Insufficient documentation

## 2019-07-15 DIAGNOSIS — Z96642 Presence of left artificial hip joint: Secondary | ICD-10-CM | POA: Diagnosis not present

## 2019-07-15 DIAGNOSIS — Y999 Unspecified external cause status: Secondary | ICD-10-CM | POA: Diagnosis not present

## 2019-07-15 DIAGNOSIS — S82831A Other fracture of upper and lower end of right fibula, initial encounter for closed fracture: Secondary | ICD-10-CM | POA: Diagnosis not present

## 2019-07-15 MED ORDER — FENTANYL CITRATE (PF) 100 MCG/2ML IJ SOLN
50.0000 ug | Freq: Once | INTRAMUSCULAR | Status: AC
  Administered 2019-07-15: 19:00:00 50 ug via INTRAVENOUS
  Filled 2019-07-15: qty 2

## 2019-07-15 MED ORDER — PROPOFOL 10 MG/ML IV BOLUS
INTRAVENOUS | Status: AC | PRN
  Administered 2019-07-15 (×2): 50 mg via INTRAVENOUS

## 2019-07-15 MED ORDER — PROPOFOL 10 MG/ML IV BOLUS
INTRAVENOUS | Status: AC
  Filled 2019-07-15: qty 20

## 2019-07-15 MED ORDER — FENTANYL CITRATE (PF) 100 MCG/2ML IJ SOLN
INTRAMUSCULAR | Status: AC | PRN
  Administered 2019-07-15: 50 ug via INTRAVENOUS

## 2019-07-15 MED ORDER — FENTANYL CITRATE (PF) 100 MCG/2ML IJ SOLN
50.0000 ug | Freq: Once | INTRAMUSCULAR | Status: AC
  Administered 2019-07-15: 50 ug via INTRAVENOUS
  Filled 2019-07-15: qty 2

## 2019-07-15 MED ORDER — PROPOFOL 10 MG/ML IV BOLUS: 0.5000 mg/kg | Freq: Once | INTRAVENOUS | Status: DC

## 2019-07-15 MED ORDER — OXYCODONE-ACETAMINOPHEN 5-325 MG PO TABS
1.0000 | ORAL_TABLET | Freq: Once | ORAL | Status: AC
  Administered 2019-07-15: 23:00:00 1 via ORAL
  Filled 2019-07-15: qty 1

## 2019-07-15 MED ORDER — ONDANSETRON HCL 4 MG/2ML IJ SOLN
4.0000 mg | Freq: Once | INTRAMUSCULAR | Status: AC
  Administered 2019-07-15: 19:00:00 4 mg via INTRAVENOUS
  Filled 2019-07-15: qty 2

## 2019-07-15 MED ORDER — FENTANYL CITRATE (PF) 100 MCG/2ML IJ SOLN
50.0000 ug | Freq: Once | INTRAMUSCULAR | Status: DC
  Filled 2019-07-15: qty 2

## 2019-07-15 MED ORDER — OXYCODONE-ACETAMINOPHEN 5-325 MG PO TABS: 1.0000 | ORAL_TABLET | Freq: Four times a day (QID) | ORAL | 0 refills | Status: DC | PRN

## 2019-07-15 NOTE — ED Notes (Signed)
Pt transported for xray.

## 2019-07-15 NOTE — ED Notes (Signed)
Called resp .@1935  and Ortho Tech  No ortho Tech at @1952  call [facility]  [address] .

## 2019-07-15 NOTE — Discharge Instructions (Signed)
You have been diagnosed today with fracture dislocation of the right ankle.  At this time there does not appear to be the presence of an emergent medical condition, however there is always the potential for conditions to change. Please read and follow the below instructions.  Please return to the Emergency Department immediately for any new or worsening symptoms. Please be sure to follow up with your Primary Care Provider within one week regarding your visit today; please call their office to schedule an appointment even if you are feeling better for a follow-up visit. Please use the crutches today and avoid placing weight on your right ankle.  Use rest, ice and elevation to help with pain.  You have been given a prescription of pain medication today called Percocet.  This is a narcotic medication and may make you drowsy.  Do not drink alcohol or take any sedating medications, including clonazepam, with Percocet as this will worsen side effects.  Do not drive or perform any dangerous activities while taking Percocet.  You may use ibuprofen as directed on the packaging while taking Percocet but do not use any Tylenol with Percocet as Percocet already contains Tylenol. Please contact your orthopedic specialist tomorrow morning to schedule a follow-up appointment for further evaluation and treatment of your ankle fracture as surgery may be needed.  You may contact your orthopedist Dr. Charlann Boxer or the on-call orthopedist Dr. Devonne Doughty.  Get help right away if you have: A severe increase in pain or swelling. Toes that tingle or become numb. Loss of feeling in your leg, foot, or ankle. Toes that are cold and blue. Pain, tenderness, or redness in your calf. A new rash around your ankle or cast, or if you notice any leakage of fluid from the skin. Bleeding around the ankle or underneath or around the cast. Chest pain. Difficulty breathing. You have any new/concerning or worsening of symptoms  Please read the  additional information packets attached to your discharge summary.  Do not take your medicine if  develop an itchy rash, swelling in your mouth or lips, or difficulty breathing; call 911 and seek immediate emergency medical attention if this occurs.  Note: Portions of this text may have been transcribed using voice recognition software. Every effort was made to ensure accuracy; however, inadvertent computerized transcription errors may still be present.

## 2019-07-15 NOTE — ED Triage Notes (Signed)
Pt reports fell down the stairs with her dogs. Has swelling to right ankle and rotation to foot.

## 2019-07-15 NOTE — Progress Notes (Addendum)
Orthopedic Tech Progress Note Patient Details:  Taylor [name] 11-03-1960 [id number] MD did an ankle reduction and needed ortho to apply the splint. Did have help from MD and tech while I was applying the splint Ortho Devices Type of Ortho Device: Stirrup splint, Post (short) splint Splint Material: Plaster Ortho Device/Splint Location: RLE Ortho Device/Splint Interventions: Application, Ordered   Post Interventions Patient Tolerated: Well Instructions Provided: Care of device, Adjustment of device   Donald Pore 07/15/2019, 9:25 PM

## 2019-07-15 NOTE — ED Provider Notes (Signed)
  Physical Exam  BP 127/90   Pulse 70   Temp 98 F (36.7 C) (Oral)   Resp 14   Ht 5\' 7"  (1.702 m)   Wt 95.3 kg   SpO2 100%   BMI 32.89 kg/m   Physical Exam  ED Course/Procedures     Sedation procedure  Date/Time: 07/15/2019 11:31 PM Performed by: 07/17/2019, MD Authorized by: Benjiman Core, MD   Consent:    Consent obtained:  Verbal   Consent given by:  Patient   Risks discussed:  Allergic reaction, dysrhythmia, nausea, inadequate sedation, respiratory compromise necessitating ventilatory assistance and intubation, prolonged sedation necessitating reversal and vomiting   Alternatives discussed:  Analgesia without sedation Universal protocol:    Immediately prior to procedure a time out was called: yes     Patient identity confirmation method:  Arm band and verbally with patient Indications:    Procedure performed:  Dislocation reduction   Procedure necessitating sedation performed by:  Physician performing sedation Pre-sedation assessment:    Time since last food or drink:  4   ASA classification: class 2 - patient with mild systemic disease     Neck mobility: normal     Mouth opening:  3 or more finger widths   Mallampati score:  II - soft palate, uvula, fauces visible   Pre-sedation assessments completed and reviewed: airway patency, cardiovascular function, hydration status and mental status     Pre-sedation assessments completed and reviewed: pre-procedure nausea and vomiting status not reviewed   Immediate pre-procedure details:    Reassessment: Patient reassessed immediately prior to procedure     Reviewed: vital signs     Verified: bag valve mask available, intubation equipment available and oxygen available   Procedure details (see MAR for exact dosages):    Preoxygenation:  Nasal cannula   Sedation:  Propofol   Intended level of sedation: deep   Analgesia:  Fentanyl   Intra-procedure events: none     Total Provider sedation time (minutes):   10 Post-procedure details:    Attendance: Constant attendance by certified staff until patient recovered     Recovery: Patient returned to pre-procedure baseline     Post-sedation assessments completed and reviewed: airway patency, cardiovascular function and hydration status     Patient is stable for discharge or admission: yes     Patient tolerance:  Tolerated well, no immediate complications    MDM  Patient with fracture dislocation.  Reduced under sedation.  Improved.       Benjiman Core, MD 07/15/19 [phone]

## 2019-07-15 NOTE — ED Provider Notes (Signed)
[facility]-EMERGENCY DEPT Provider Note   CSN: [id number] Arrival date & time: 07/15/19  1827     History Chief Complaint  Patient presents with  . Foot Injury  . Fall  . Ankle Pain    Taylor [name] is a 59 y.o. female presents today for right ankle pain.  Patient was walking down the steps today with her dog when she slipped sliding down 3 of the steps that occurred approximately 1.5-2 hours prior to arrival.  She reports an immediate pain of her right ankle a severe sharp throb constant worsened with movement and palpation nonradiating and somewhat improved with not moving the area.  She noted a deformity immediately and went to a urgent care for evaluation who then sent her here.  She reports that when she slid down the stairs she was able to catch herself from sliding all the way down, she denies head injury, loss of consciousness, blood thinner use, neck pain, back pain, chest pain, abdominal pain, numbness/tingling, weakness or any other injury aside from the right ankle.  HPI     Past Medical History:  Diagnosis Date  . ADD (attention deficit disorder)   . Arthritis    Osteoarthritis  . Complication of anesthesia   . Depression   . Eczema    elbows  . Hypertension   . PONV (postoperative nausea and vomiting)    general anesthesia only, not with spinal    Patient Active Problem List   Diagnosis Date Noted  . 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 Expected blood loss anemia 08/26/2013  . Obese 08/26/2013  . S/P left THA, AA 08/25/2013    Past Surgical History:  Procedure Laterality Date  . ABDOMINAL HYSTERECTOMY    . COLONOSCOPY     age 66  . KNEE ARTHROSCOPY  2003 / 2014   rt  . TOTAL HIP ARTHROPLASTY Left 08/25/2013   Procedure: LEFT TOTAL HIP ARTHROPLASTY ANTERIOR APPROACH;  Surgeon: Shelda Pal, MD;  Location: [facility];  Service: Orthopedics;  Laterality: Left;  . TOTAL KNEE ARTHROPLASTY Bilateral 07/02/2016   Procedure: TOTAL KNEE  BILATERAL ARTHROPLASTY;  Surgeon: Durene Romans, MD;  Location: [facility];  Service: Orthopedics;  Laterality: Bilateral;  . TUBAL LIGATION     '96     OB History   No obstetric history on file.     No family history on file.  Social History   Tobacco Use  . Smoking status: Never Smoker  . Smokeless tobacco: Never Used  Substance Use Topics  . Alcohol use: Yes    Comment: occasional  . Drug use: No    Home Medications Prior to Admission medications   Medication Sig Start Date End Date Taking? Authorizing Provider  amphetamine-dextroamphetamine (ADDERALL XR) 20 MG 24 hr capsule Take 20 mg by mouth 2 (two) times daily. 05/29/16  Yes [provider]  buPROPion (WELLBUTRIN XL) 300 MG 24 hr tablet Take 300 mg by mouth daily.  06/16/16  Yes [provider]  Cholecalciferol (VITAMIN D3) 10000 units TABS Take 10,000 Units by mouth daily.   Yes [provider]  clonazePAM (KLONOPIN) 1 MG tablet Take 1 mg by mouth as needed for anxiety.   Yes [provider]  hydrochlorothiazide (HYDRODIURIL) 25 MG tablet Take 25 mg by mouth every morning.   Yes [provider]  olmesartan (BENICAR) 20 MG tablet Take 20 mg by mouth every morning.   Yes [provider]  vitamin B-12 (CYANOCOBALAMIN) 1000 MCG tablet  Take 1,000 mcg by mouth daily.   Yes [provider]  docusate sodium (COLACE) 100 MG capsule Take 1 capsule (100 mg total) by mouth 2 (two) times daily. Patient not taking: Reported on 07/15/2019 07/04/16   Lanney Gins, PA-C  ferrous sulfate 325 (65 FE) MG tablet Take 1 tablet (325 mg total) by mouth 3 (three) times daily after meals. Patient not taking: Reported on 07/15/2019 07/04/16   Lanney Gins, PA-C  methocarbamol (ROBAXIN) 500 MG tablet Take 1 tablet (500 mg total) by mouth every 6 (six) hours as needed for muscle spasms. Patient not taking: Reported on 07/15/2019 08/26/13   Lanney Gins, PA-C  oxyCODONE (OXY IR/ROXICODONE) 5  MG immediate release tablet Take 1-3 tablets (5-15 mg total) by mouth every 4 (four) hours. Patient not taking: Reported on 07/15/2019 07/04/16   Lanney Gins, PA-C  oxyCODONE-acetaminophen (PERCOCET/ROXICET) 5-325 MG tablet Take 1-2 tablets by mouth every 6 (six) hours as needed for severe pain. 07/15/19   Harlene Salts A, PA-C  polyethylene glycol (MIRALAX / GLYCOLAX) packet Take 17 g by mouth 2 (two) times daily. Patient not taking: Reported on 07/15/2019 07/04/16   Lanney Gins, PA-C  rivaroxaban (XARELTO) 10 MG TABS tablet Take 1 tablet (10 mg total) by mouth daily. Take for 14 days.  Start aspirin the day after finishing the Xarelto. Patient not taking: Reported on 07/15/2019 07/05/16   Lanney Gins, PA-C    Allergies    Patient has no known allergies.  Review of Systems   Review of Systems Ten systems are reviewed and are negative for acute change except as noted in the HPI  Physical Exam Updated Vital Signs BP 116/82   Pulse 73   Temp 98 F (36.7 C) (Oral)   Resp (!) 23   Ht 5\' 7"  (1.702 m)   Wt 95.3 kg   SpO2 98%   BMI 32.89 kg/m   Physical Exam Constitutional:      General: She is not in acute distress.    Appearance: Normal appearance. She is well-developed. She is not ill-appearing or diaphoretic.  HENT:     Head: Normocephalic and atraumatic. No raccoon eyes or Battle's sign.     Jaw: There is normal jaw occlusion. No trismus.     Right Ear: Tympanic membrane and external ear normal. No hemotympanum.     Left Ear: Tympanic membrane and external ear normal. No hemotympanum.     Nose: Nose normal.     Right Nostril: No epistaxis.     Left Nostril: No epistaxis.     Mouth/Throat:     Mouth: Mucous membranes are moist.     Pharynx: Oropharynx is clear.  Eyes:     General: Vision grossly intact. Gaze aligned appropriately.     Extraocular Movements: Extraocular movements intact.     Conjunctiva/sclera: Conjunctivae normal.     Pupils: Pupils are equal,  round, and reactive to light.  Neck:     Trachea: Trachea and phonation normal. No tracheal tenderness or tracheal deviation.  Cardiovascular:     Pulses:          Dorsalis pedis pulses are 2+ on the right side and 2+ on the left side.  Pulmonary:     Effort: Pulmonary effort is normal. No respiratory distress.  Chest:     Chest wall: No tenderness.  Abdominal:     General: There is no distension.     Palpations: Abdomen is soft.     Tenderness: There is no  abdominal tenderness. There is no guarding or rebound.  Musculoskeletal:        General: Normal range of motion.     Cervical back: Full passive range of motion without pain, normal range of motion and neck supple.     Comments: No midline C/T/L spinal tenderness to palpation, no paraspinal muscle tenderness, no deformity, crepitus, or step-off noted. No sign of injury to the neck or back. - Hips stable to compression bilaterally without pain. - Right ankle with deformity, externally rotated.  Capillary refill and sensation intact to all toes, pedal pulses intact.  No obvious tenting. - All other major joints mobilized with appropriate range of motion and strength without pain.  Feet:     Right foot:     Protective Sensation: 5 sites tested. 5 sites sensed.     Left foot:     Protective Sensation: 5 sites tested. 5 sites sensed.  Skin:    General: Skin is warm and dry.  Neurological:     Mental Status: She is alert.     GCS: GCS eye subscore is 4. GCS verbal subscore is 5. GCS motor subscore is 6.     Comments: Speech is clear and goal oriented, follows commands Major Cranial nerves without deficit, no facial droop Moves extremities without ataxia, coordination intact  Psychiatric:        Behavior: Behavior normal.     ED Results / Procedures / Treatments   Labs (all labs ordered are listed, but only abnormal results are displayed) Labs Reviewed - No data to display  EKG None  Radiology DG Ankle Complete  Right  Result Date: 07/15/2019 CLINICAL DATA:  Post reduction EXAM: RIGHT ANKLE - COMPLETE 3+ VIEW COMPARISON:  07/15/2019 FINDINGS: Interval casting of the ankle which limits bone detail. Reduction of previously noted lateral and posterior ankle dislocation. Acute mildly comminuted distal fibular fracture with residual 1/4 bone with lateral and posterior displacement. No residual angulation. Mildly displaced posterior malleolar fracture fragment. IMPRESSION: Reduction of previously noted ankle dislocation. Distal fibular fracture with decreased displacement and angulation. Crescent shaped fracture fragment posterior to the tibia likely reflecting posterior malleolar fracture fragment. Electronically Signed   By: Donavan Foil M.D.   On: 07/15/2019 21:33   DG Ankle Complete Right  Result Date: 07/15/2019 CLINICAL DATA:  59 year old female with fall and trauma to the right ankle. EXAM: RIGHT ANKLE - COMPLETE 3+ VIEW COMPARISON:  None. FINDINGS: There is a displaced and angulated fracture of the distal fibula with lateral angulation of the distal fracture fragment. A fracture of the posterior malleolus and possibly medial malleolus suspected but not seen with certainty. A tiny bone fragment medial to the talus likely arises from the medial malleolus or talar cortex. There is lateral angulation and dislocation of the ankle mortise. There is posterior displacement of the talar dome in relation to the tibial plafond. Degenerative changes of the tarsal joints noted. There is soft tissue swelling of the ankle. IMPRESSION: 1. Angulated and displaced fracture of the distal fibula with possible fracture of posterior or medial malleoli. CT may provide better evaluation if clinically indicated. 2. Lateral and posterior dislocation of the ankle mortise. Electronically Signed   By: Anner Crete M.D.   On: 07/15/2019 19:22    Procedures .Ortho Injury Treatment  Date/Time: 07/15/2019 10:26 PM Performed by: Deliah Boston, PA-C Authorized by: Deliah Boston, PA-C   Consent:    Consent obtained:  Verbal   Consent given by:  Patient  Risks discussed:  Fracture, restricted joint movement, nerve damage, recurrent dislocation, vascular damage, stiffness and irreducible dislocationInjury location: ankle Location details: right ankle Injury type: fracture-dislocation Fracture type: bimalleolar Pre-procedure neurovascular assessment: neurovascularly intact Pre-procedure distal perfusion: normal Pre-procedure neurological function: normal Pre-procedure range of motion: reduced  Anesthesia: Local anesthesia used: no  Patient sedated: Yes. Refer to sedation procedure documentation for details of sedation. Manipulation performed: yes Reduction successful: yes X-ray confirmed reduction: yes Immobilization: splint Splint type: ankle stirrup and sugar tong Supplies used: cotton padding and plaster Post-procedure neurovascular assessment: post-procedure neurovascularly intact Post-procedure distal perfusion: normal Post-procedure neurological function: normal Post-procedure range of motion: unchanged Patient tolerance: patient tolerated the procedure well with no immediate complications    (including critical care time)  Medications Ordered in ED Medications  propofol (DIPRIVAN) 10 mg/mL bolus/IV push 47.7 mg (47.7 mg Intravenous See Procedure Record 07/15/19 2103)  fentaNYL (SUBLIMAZE) injection 50 mcg (50 mcg Intravenous See Procedure Record 07/15/19 2100)  oxyCODONE-acetaminophen (PERCOCET/ROXICET) 5-325 MG per tablet 1 tablet (has no administration in time range)  fentaNYL (SUBLIMAZE) injection 50 mcg (50 mcg Intravenous Given 07/15/19 1903)  ondansetron (ZOFRAN) injection 4 mg (4 mg Intravenous Given 07/15/19 1903)  fentaNYL (SUBLIMAZE) injection 50 mcg (50 mcg Intravenous Given 07/15/19 1958)  fentaNYL (SUBLIMAZE) injection (50 mcg Intravenous Given 07/15/19 2100)  propofol (DIPRIVAN) 10  mg/mL bolus/IV push (50 mg Intravenous Given 07/15/19 2103)    ED Course  I have reviewed the triage vital signs and the nursing notes.  Pertinent labs & imaging results that were available during my care of the patient were reviewed by me and considered in my medical decision making (see chart for details).    MDM Rules/Calculators/A&P                     Patient with deformity of right ankle after sliding down stairs today with her dogs.  She denies other injury.  There is external rotation of the right foot.  Patient neurovascular intact to the extremity, no skin break or tenting.  50 mcg fentanyl ordered in addition to x-ray, plan to reduce.  Patient seen and evaluated by Dr. Rubin PayorPickering. - DG Right Ankle: IMPRESSION:  1. Angulated and displaced fracture of the distal fibula with  possible fracture of posterior or medial malleoli. CT may provide  better evaluation if clinically indicated.  2. Lateral and posterior dislocation of the ankle mortise.   - Conscious sedation performed by Dr. Rubin PayorPickering.  Reduction performed as above with Dr. Rubin PayorPickering.  Splint applied with orthopedic technician. - DG Right Ankle:  IMPRESSION:  Reduction of previously noted ankle dislocation. Distal fibular  fracture with decreased displacement and angulation. Crescent shaped  fracture fragment posterior to the tibia likely reflecting posterior  malleolar fracture fragment.   Patient reevaluated resting comfortably talking on phone.  She reports that splint is comfortable.  Capillary refill and sensation intact to all toes.  She will be given prescription for Percocet due to her acute fracture, PDMP reviewed patient does receive clonazepam prescription, no pain medications, patient advised not to take clonazepam with Percocet.  Crutches provided by nursing staff.  Patient's husband here to drive home today.  Patient states understanding of narcotic cautions and has no questions.  She will be given referral to  orthopedist for further evaluation and management, encouraged to call their office tomorrow morning to schedule an appointment, she is aware she may also follow-up with her orthopedist Dr. Charlann Boxerlin if she wishes.  At this  time there does not appear to be any evidence of an acute emergency medical condition and the patient appears stable for discharge with appropriate outpatient follow up. Diagnosis was discussed with patient who verbalizes understanding of care plan and is agreeable to discharge. I have discussed return precautions with patient who verbalizes understanding of return precautions. Patient encouraged to follow-up with their PCP and ortho. All questions answered.  Patient seen and evaluated by Dr. Rubin Payor during this visit who agrees with discharge with pain medication and orthopedic follow-up.  Note: Portions of this report may have been transcribed using voice recognition software. Every effort was made to ensure accuracy; however, inadvertent computerized transcription errors may still be present. Final Clinical Impression(s) / ED Diagnoses Final diagnoses:  Ankle dislocation, right, initial encounter  Closed fracture of distal end of right fibula, unspecified fracture morphology, initial encounter  Closed fracture of posterior malleolus of right tibia, initial encounter    Rx / DC Orders ED Discharge Orders         Ordered    oxyCODONE-acetaminophen (PERCOCET/ROXICET) 5-325 MG tablet  Every 6 hours PRN     07/15/19 2226           Elizabeth Palau 07/15/19 2228    Benjiman Core, MD 07/15/19 2329

## 2019-07-21 ENCOUNTER — Other Ambulatory Visit: Payer: Self-pay

## 2019-07-21 ENCOUNTER — Encounter (HOSPITAL_BASED_OUTPATIENT_CLINIC_OR_DEPARTMENT_OTHER): Payer: Self-pay | Admitting: Orthopedic Surgery

## 2019-07-21 NOTE — H&P (Signed)
Patient's anticipated LOS is less than 2 midnights, meeting these requirements: - Younger than 66 - Lives within 1 hour of care - Has a competent adult at home to recover with post-op recover - NO history of  - Chronic pain requiring opiods  - Diabetes  - Coronary Artery Disease  - Heart failure  - Heart attack  - Stroke  - DVT/VTE  - Cardiac arrhythmia  - Respiratory Failure/COPD  - Renal failure  - Anemia  - Advanced Liver disease       Taylor [name] is an 59 y.o. female.    Chief Complaint: right ankle pain  HPI: Pt is a 59 y.o. female complaining of right ankle pain s/p recent fall. Pain had continually increased since the beginning. X-rays in the clinic show displaced trimalleolar fracture. Pt has tried various conservative treatments which have failed to alleviate their symptoms. Various options are discussed with the patient. Risks, benefits and expectations were discussed with the patient. Patient understand the risks, benefits and expectations and wishes to proceed with surgery.   PCP:  Camille Bal, PA-C  D/C Plans: Home  PMH: Past Medical History:  Diagnosis Date  . ADD (attention deficit disorder)   . Arthritis    Osteoarthritis  . Complication of anesthesia   . Depression   . Eczema    elbows  . Hypertension   . PONV (postoperative nausea and vomiting)    general anesthesia only, not with spinal    PSH: Past Surgical History:  Procedure Laterality Date  . ABDOMINAL HYSTERECTOMY    . COLONOSCOPY     age 64  . KNEE ARTHROSCOPY  2003 / 2014   rt  . TOTAL HIP ARTHROPLASTY Left 08/25/2013   Procedure: LEFT TOTAL HIP ARTHROPLASTY ANTERIOR APPROACH;  Surgeon: Mauri Pole, MD;  Location: [facility];  Service: Orthopedics;  Laterality: Left;  . TOTAL KNEE ARTHROPLASTY Bilateral 07/02/2016   Procedure: TOTAL KNEE BILATERAL ARTHROPLASTY;  Surgeon: Paralee Cancel, MD;  Location: [facility];  Service: Orthopedics;  Laterality: Bilateral;  . TUBAL LIGATION     '96    Social History:  reports that she has never smoked. She has never used smokeless tobacco. She reports current alcohol use. She reports that she does not use drugs.  Allergies:  No Known Allergies  Medications: No current facility-administered medications for this encounter.   Current Outpatient Medications  Medication Sig Dispense Refill  . amphetamine-dextroamphetamine (ADDERALL XR) 20 MG 24 hr capsule Take 20 mg by mouth 2 (two) times daily.  0  . buPROPion (WELLBUTRIN XL) 300 MG 24 hr tablet Take 300 mg by mouth daily.   5  . Cholecalciferol (VITAMIN D3) 10000 units TABS Take 10,000 Units by mouth daily.    . clonazePAM (KLONOPIN) 1 MG tablet Take 1 mg by mouth as needed for anxiety.    . docusate sodium (COLACE) 100 MG capsule Take 1 capsule (100 mg total) by mouth 2 (two) times daily. (Patient not taking: Reported on 07/15/2019) 10 capsule 0  . ferrous sulfate 325 (65 FE) MG tablet Take 1 tablet (325 mg total) by mouth 3 (three) times daily after meals. (Patient not taking: Reported on 07/15/2019)  3  . hydrochlorothiazide (HYDRODIURIL) 25 MG tablet Take 25 mg by mouth every morning.    . methocarbamol (ROBAXIN) 500 MG tablet Take 1 tablet (500 mg total) by mouth every 6 (six) hours as needed for muscle spasms. (Patient not taking: Reported on 07/15/2019) 50 tablet 0  . olmesartan (  BENICAR) 20 MG tablet Take 20 mg by mouth every morning.    Marland Kitchen oxyCODONE (OXY IR/ROXICODONE) 5 MG immediate release tablet Take 1-3 tablets (5-15 mg total) by mouth every 4 (four) hours. (Patient not taking: Reported on 07/15/2019) 30 tablet 0  . oxyCODONE-acetaminophen (PERCOCET/ROXICET) 5-325 MG tablet Take 1-2 tablets by mouth every 6 (six) hours as needed for severe pain. 6 tablet 0  . polyethylene glycol (MIRALAX / GLYCOLAX) packet Take 17 g by mouth 2 (two) times daily. (Patient not taking: Reported on 07/15/2019) 14 each 0  . rivaroxaban (XARELTO) 10 MG TABS tablet Take 1 tablet (10 mg total) by mouth  daily. Take for 14 days.  Start aspirin the day after finishing the Xarelto. (Patient not taking: Reported on 07/15/2019) 14 tablet 0  . vitamin B-12 (CYANOCOBALAMIN) 1000 MCG tablet Take 1,000 mcg by mouth daily.      No results found for this or any previous visit (from the past 48 hour(s)). No results found.  ROS: Pain with rom of the right lower extremity  Physical Exam: Alert and oriented 59 y.o. female in no acute distress Cranial nerves 2-12 intact Cervical spine: full rom with no tenderness, nv intact distally Chest: active breath sounds bilaterally, no wheeze rhonchi or rales Heart: regular rate and rhythm, no murmur Abd: non tender non distended with active bowel sounds Hip is stable with rom  Right lower extremity in splint No signs of fracture blisters or open injury nv intact distally   Assessment/Plan Assessment: right ankle fracture  Plan:  Patient will undergo a right ankle ORIF by Dr. Ranell Patrick at [facility]. Risks benefits and expectations were discussed with the patient. Patient understand risks, benefits and expectations and wishes to proceed. Preoperative templating of the joint replacement has been completed, documented, and submitted to the Operating Room personnel in order to optimize intra-operative equipment management.   Alphonsa Overall PA-C, MPAS [facility] is now [employer] [address], [address], [place], [place] [address] Phone: [phone] [email or web address] Facebook  [employer]

## 2019-07-22 ENCOUNTER — Other Ambulatory Visit (HOSPITAL_COMMUNITY)
Admission: RE | Admit: 2019-07-22 | Discharge: 2019-07-22 | Disposition: A | Discharge status: Home / Self Care | Payer: BC Managed Care – PPO | Source: Ambulatory Visit | Attending: Orthopedic Surgery | Admitting: Orthopedic Surgery

## 2019-07-22 DIAGNOSIS — Z01812 Encounter for preprocedural laboratory examination: Secondary | ICD-10-CM | POA: Insufficient documentation

## 2019-07-22 DIAGNOSIS — Z20822 Contact with and (suspected) exposure to covid-19: Secondary | ICD-10-CM | POA: Insufficient documentation

## 2019-07-22 LAB — SARS CORONAVIRUS 2 (TAT 6-24 HRS): SARS Coronavirus 2: NEGATIVE

## 2019-07-24 ENCOUNTER — Ambulatory Visit (HOSPITAL_BASED_OUTPATIENT_CLINIC_OR_DEPARTMENT_OTHER): Payer: BC Managed Care – PPO | Admitting: Certified Registered"

## 2019-07-24 ENCOUNTER — Ambulatory Visit (HOSPITAL_BASED_OUTPATIENT_CLINIC_OR_DEPARTMENT_OTHER)
Admission: RE | Admit: 2019-07-24 | Discharge: 2019-07-24 | Disposition: A | Discharge status: Home / Self Care | Payer: BC Managed Care – PPO | Attending: Orthopedic Surgery | Admitting: Orthopedic Surgery

## 2019-07-24 ENCOUNTER — Other Ambulatory Visit: Payer: Self-pay

## 2019-07-24 ENCOUNTER — Encounter (HOSPITAL_BASED_OUTPATIENT_CLINIC_OR_DEPARTMENT_OTHER): Payer: Self-pay | Admitting: Orthopedic Surgery

## 2019-07-24 ENCOUNTER — Encounter (HOSPITAL_BASED_OUTPATIENT_CLINIC_OR_DEPARTMENT_OTHER)
Admission: RE | Disposition: A | Discharge status: Home / Self Care | Payer: Self-pay | Source: Home / Self Care | Attending: Orthopedic Surgery

## 2019-07-24 DIAGNOSIS — W19XXXA Unspecified fall, initial encounter: Secondary | ICD-10-CM | POA: Diagnosis not present

## 2019-07-24 DIAGNOSIS — Z7982 Long term (current) use of aspirin: Secondary | ICD-10-CM | POA: Insufficient documentation

## 2019-07-24 DIAGNOSIS — Z96642 Presence of left artificial hip joint: Secondary | ICD-10-CM | POA: Diagnosis not present

## 2019-07-24 DIAGNOSIS — S82851A Displaced trimalleolar fracture of right lower leg, initial encounter for closed fracture: Secondary | ICD-10-CM | POA: Diagnosis not present

## 2019-07-24 DIAGNOSIS — F329 Major depressive disorder, single episode, unspecified: Secondary | ICD-10-CM | POA: Insufficient documentation

## 2019-07-24 DIAGNOSIS — M1990 Unspecified osteoarthritis, unspecified site: Secondary | ICD-10-CM | POA: Insufficient documentation

## 2019-07-24 DIAGNOSIS — F988 Other specified behavioral and emotional disorders with onset usually occurring in childhood and adolescence: Secondary | ICD-10-CM | POA: Diagnosis not present

## 2019-07-24 DIAGNOSIS — D649 Anemia, unspecified: Secondary | ICD-10-CM | POA: Diagnosis not present

## 2019-07-24 DIAGNOSIS — Z96653 Presence of artificial knee joint, bilateral: Secondary | ICD-10-CM | POA: Diagnosis not present

## 2019-07-24 DIAGNOSIS — I10 Essential (primary) hypertension: Secondary | ICD-10-CM | POA: Insufficient documentation

## 2019-07-24 DIAGNOSIS — L309 Dermatitis, unspecified: Secondary | ICD-10-CM | POA: Insufficient documentation

## 2019-07-24 DIAGNOSIS — Z90710 Acquired absence of both cervix and uterus: Secondary | ICD-10-CM | POA: Diagnosis not present

## 2019-07-24 DIAGNOSIS — Z7901 Long term (current) use of anticoagulants: Secondary | ICD-10-CM | POA: Insufficient documentation

## 2019-07-24 DIAGNOSIS — Z79899 Other long term (current) drug therapy: Secondary | ICD-10-CM | POA: Diagnosis not present

## 2019-07-24 HISTORY — PX: ORIF ANKLE FRACTURE: SHX5408

## 2019-07-24 LAB — BASIC METABOLIC PANEL
Anion gap: 12 (ref 5–15)
BUN: 19 mg/dL (ref 6–20)
CO2: 23 mmol/L (ref 22–32)
Calcium: 10.1 mg/dL (ref 8.9–10.3)
Chloride: 105 mmol/L (ref 98–111)
Creatinine, Ser: 1.19 mg/dL — ABNORMAL HIGH (ref 0.44–1.00)
GFR calc Af Amer: 58 mL/min — ABNORMAL LOW (ref >60)
GFR calc non Af Amer: 50 mL/min — ABNORMAL LOW (ref >60)
Glucose, Bld: 110 mg/dL — ABNORMAL HIGH (ref 70–99)
Potassium: 3.7 mmol/L (ref 3.5–5.1)
Sodium: 140 mmol/L (ref 135–145)

## 2019-07-24 SURGERY — OPEN REDUCTION INTERNAL FIXATION (ORIF) ANKLE FRACTURE
Anesthesia: General | Site: Ankle | Laterality: Right

## 2019-07-24 MED ORDER — ONDANSETRON HCL 4 MG/2ML IJ SOLN
INTRAMUSCULAR | Status: AC
  Filled 2019-07-24: qty 2

## 2019-07-24 MED ORDER — MIDAZOLAM HCL 2 MG/2ML IJ SOLN
INTRAMUSCULAR | Status: AC
  Filled 2019-07-24: qty 2

## 2019-07-24 MED ORDER — FENTANYL CITRATE (PF) 100 MCG/2ML IJ SOLN
INTRAMUSCULAR | Status: AC
  Filled 2019-07-24: qty 2

## 2019-07-24 MED ORDER — PROPOFOL 10 MG/ML IV BOLUS
INTRAVENOUS | Status: DC | PRN
  Administered 2019-07-24: 200 mg via INTRAVENOUS
  Administered 2019-07-24: 20 mg via INTRAVENOUS

## 2019-07-24 MED ORDER — ACETAMINOPHEN 500 MG PO TABS
1000.0000 mg | ORAL_TABLET | Freq: Once | ORAL | Status: AC
  Administered 2019-07-24: 10:00:00 1000 mg via ORAL

## 2019-07-24 MED ORDER — HYDROMORPHONE HCL 1 MG/ML IJ SOLN: 0.2500 mg | INTRAMUSCULAR | Status: DC | PRN

## 2019-07-24 MED ORDER — PROPOFOL 10 MG/ML IV BOLUS
INTRAVENOUS | Status: AC
  Filled 2019-07-24: qty 20

## 2019-07-24 MED ORDER — SCOPOLAMINE 1 MG/3DAYS TD PT72
1.0000 | MEDICATED_PATCH | TRANSDERMAL | Status: DC
  Administered 2019-07-24: 11:00:00 1.5 mg via TRANSDERMAL

## 2019-07-24 MED ORDER — ONDANSETRON HCL 4 MG/2ML IJ SOLN
INTRAMUSCULAR | Status: DC | PRN
  Administered 2019-07-24: 4 mg via INTRAVENOUS

## 2019-07-24 MED ORDER — LACTATED RINGERS IV SOLN: INTRAVENOUS | Status: DC

## 2019-07-24 MED ORDER — BUPIVACAINE-EPINEPHRINE (PF) 0.5% -1:200000 IJ SOLN
INTRAMUSCULAR | Status: DC | PRN
  Administered 2019-07-24: 25 mL via PERINEURAL
  Administered 2019-07-24: 15 mL via PERINEURAL

## 2019-07-24 MED ORDER — LIDOCAINE 2% (20 MG/ML) 5 ML SYRINGE
INTRAMUSCULAR | Status: AC
  Filled 2019-07-24: qty 5

## 2019-07-24 MED ORDER — SCOPOLAMINE 1 MG/3DAYS TD PT72
MEDICATED_PATCH | TRANSDERMAL | Status: AC
  Filled 2019-07-24: qty 1

## 2019-07-24 MED ORDER — BUPIVACAINE HCL (PF) 0.25 % IJ SOLN
INTRAMUSCULAR | Status: AC
  Filled 2019-07-24: qty 30

## 2019-07-24 MED ORDER — CHLORHEXIDINE GLUCONATE 4 % EX LIQD: 60.0000 mL | Freq: Once | CUTANEOUS | Status: DC

## 2019-07-24 MED ORDER — CEFAZOLIN SODIUM-DEXTROSE 2-4 GM/100ML-% IV SOLN
2.0000 g | INTRAVENOUS | Status: AC
  Administered 2019-07-24: 2 g via INTRAVENOUS

## 2019-07-24 MED ORDER — DEXAMETHASONE SODIUM PHOSPHATE 10 MG/ML IJ SOLN
INTRAMUSCULAR | Status: AC
  Filled 2019-07-24: qty 1

## 2019-07-24 MED ORDER — FENTANYL CITRATE (PF) 100 MCG/2ML IJ SOLN
INTRAMUSCULAR | Status: DC | PRN
  Administered 2019-07-24 (×4): 25 ug via INTRAVENOUS

## 2019-07-24 MED ORDER — CEFAZOLIN SODIUM-DEXTROSE 2-4 GM/100ML-% IV SOLN
INTRAVENOUS | Status: AC
  Filled 2019-07-24: qty 100

## 2019-07-24 MED ORDER — ACETAMINOPHEN 500 MG PO TABS
ORAL_TABLET | ORAL | Status: AC
  Filled 2019-07-24: qty 2

## 2019-07-24 MED ORDER — HYDROCODONE-ACETAMINOPHEN 5-325 MG PO TABS: 1.0000 | ORAL_TABLET | Freq: Four times a day (QID) | ORAL | 0 refills | Status: AC | PRN

## 2019-07-24 MED ORDER — ASPIRIN 81 MG PO CHEW: 81.0000 mg | CHEWABLE_TABLET | Freq: Two times a day (BID) | ORAL | 0 refills | Status: AC

## 2019-07-24 MED ORDER — 0.9 % SODIUM CHLORIDE (POUR BTL) OPTIME
TOPICAL | Status: DC | PRN
  Administered 2019-07-24: 1000 mL

## 2019-07-24 MED ORDER — FENTANYL CITRATE (PF) 100 MCG/2ML IJ SOLN
50.0000 ug | INTRAMUSCULAR | Status: DC | PRN
  Administered 2019-07-24: 11:00:00 100 ug via INTRAVENOUS

## 2019-07-24 MED ORDER — MIDAZOLAM HCL 2 MG/2ML IJ SOLN
1.0000 mg | INTRAMUSCULAR | Status: DC | PRN
  Administered 2019-07-24: 11:00:00 2 mg via INTRAVENOUS

## 2019-07-24 SURGICAL SUPPLY — 63 items
BAG DECANTER FOR FLEXI CONT (MISCELLANEOUS) IMPLANT
BANDAGE ESMARK 6X9 LF (GAUZE/BANDAGES/DRESSINGS) ×1 IMPLANT
BENZOIN TINCTURE PRP APPL 2/3 (GAUZE/BANDAGES/DRESSINGS) IMPLANT
BIT DRILL 2.5X2.75 QC CALB (BIT) ×3 IMPLANT
BLADE SURG 15 STRL LF DISP TIS (BLADE) ×2 IMPLANT
BLADE SURG 15 STRL SS (BLADE) ×4
BNDG ELASTIC 4X5.8 VLCR STR LF (GAUZE/BANDAGES/DRESSINGS) ×3 IMPLANT
BNDG ELASTIC 6X5.8 VLCR STR LF (GAUZE/BANDAGES/DRESSINGS) ×3 IMPLANT
BNDG ESMARK 6X9 LF (GAUZE/BANDAGES/DRESSINGS) ×3
BNDG GAUZE ELAST 4 BULKY (GAUZE/BANDAGES/DRESSINGS) ×3 IMPLANT
CLOSURE WOUND 1/2 X4 (GAUZE/BANDAGES/DRESSINGS)
COVER BACK TABLE 60X90IN (DRAPES) ×3 IMPLANT
COVER MAYO STAND STRL (DRAPES) ×3 IMPLANT
COVER WAND RF STERILE (DRAPES) IMPLANT
CUFF TOURN SGL QUICK 34 (TOURNIQUET CUFF) ×2
CUFF TRNQT CYL 34X4.125X (TOURNIQUET CUFF) ×1 IMPLANT
DECANTER SPIKE VIAL GLASS SM (MISCELLANEOUS) IMPLANT
DRAPE EXTREMITY T 121X128X90 (DISPOSABLE) ×3 IMPLANT
DRAPE OEC MINIVIEW 54X84 (DRAPES) ×3 IMPLANT
DRAPE SURG 17X23 STRL (DRAPES) ×3 IMPLANT
DURAPREP 26ML APPLICATOR (WOUND CARE) ×3 IMPLANT
ELECT REM PT RETURN 9FT ADLT (ELECTROSURGICAL) ×3
ELECTRODE REM PT RTRN 9FT ADLT (ELECTROSURGICAL) ×1 IMPLANT
GAUZE XEROFORM 1X8 LF (GAUZE/BANDAGES/DRESSINGS) ×3 IMPLANT
GLOVE BIOGEL PI IND STRL 7.5 (GLOVE) ×1 IMPLANT
GLOVE BIOGEL PI IND STRL 8.5 (GLOVE) ×1 IMPLANT
GLOVE BIOGEL PI INDICATOR 7.5 (GLOVE) ×2
GLOVE BIOGEL PI INDICATOR 8.5 (GLOVE) ×2
GLOVE SURG ORTHO 8.0 STRL STRW (GLOVE) ×3 IMPLANT
GOWN STRL REUS W/ TWL LRG LVL3 (GOWN DISPOSABLE) IMPLANT
GOWN STRL REUS W/ TWL XL LVL3 (GOWN DISPOSABLE) ×3 IMPLANT
GOWN STRL REUS W/TWL LRG LVL3 (GOWN DISPOSABLE)
GOWN STRL REUS W/TWL XL LVL3 (GOWN DISPOSABLE) ×6
NEEDLE HYPO 25X1 1.5 SAFETY (NEEDLE) IMPLANT
NS IRRIG 1000ML POUR BTL (IV SOLUTION) ×3 IMPLANT
PACK BASIN DAY SURGERY FS (CUSTOM PROCEDURE TRAY) ×3 IMPLANT
PAD CAST 4YDX4 CTTN HI CHSV (CAST SUPPLIES) ×1 IMPLANT
PADDING CAST ABS 4INX4YD NS (CAST SUPPLIES) ×2
PADDING CAST ABS COTTON 4X4 ST (CAST SUPPLIES) ×1 IMPLANT
PADDING CAST COTTON 4X4 STRL (CAST SUPPLIES) ×2
PENCIL SMOKE EVACUATOR (MISCELLANEOUS) ×3 IMPLANT
PLATE TUB 100DEG 5 HO (Plate) ×3 IMPLANT
SCREW CORTICAL 3.5MM 14MM (Screw) ×9 IMPLANT
SHEET MEDIUM DRAPE 40X70 STRL (DRAPES) ×3 IMPLANT
SPLINT FAST PLASTER 5X30 (CAST SUPPLIES) ×40
SPLINT PLASTER CAST FAST 5X30 (CAST SUPPLIES) ×20 IMPLANT
STAPLER VISISTAT 35W (STAPLE) ×3 IMPLANT
STOCKINETTE TUBULAR 6 INCH (GAUZE/BANDAGES/DRESSINGS) ×3 IMPLANT
STRIP CLOSURE SKIN 1/2X4 (GAUZE/BANDAGES/DRESSINGS) IMPLANT
SUCTION FRAZIER HANDLE 10FR (MISCELLANEOUS) ×2
SUCTION TUBE FRAZIER 10FR DISP (MISCELLANEOUS) ×1 IMPLANT
SUT MNCRL AB 4-0 PS2 18 (SUTURE) IMPLANT
SUT VIC AB 0 CT1 27 (SUTURE) ×2
SUT VIC AB 0 CT1 27XBRD ANBCTR (SUTURE) ×1 IMPLANT
SUT VIC AB 2-0 CT1 (SUTURE) ×3 IMPLANT
SUT VIC AB 3-0 SH 27 (SUTURE)
SUT VIC AB 3-0 SH 27X BRD (SUTURE) IMPLANT
SYR CONTROL 10ML LL (SYRINGE) IMPLANT
TOWEL GREEN STERILE FF (TOWEL DISPOSABLE) ×3 IMPLANT
TUBE CONNECTING 20'X1/4 (TUBING) ×1
TUBE CONNECTING 20X1/4 (TUBING) ×2 IMPLANT
UNDERPAD 30X36 HEAVY ABSORB (UNDERPADS AND DIAPERS) ×3 IMPLANT
YANKAUER SUCT BULB TIP NO VENT (SUCTIONS) ×3 IMPLANT

## 2019-07-24 NOTE — Anesthesia Procedure Notes (Signed)
Procedure Name: LMA Insertion Date/Time: 07/24/2019 1:22 PM Performed by: Lucinda Dell, CRNA Pre-anesthesia Checklist: Patient identified, Emergency Drugs available, Suction available and Patient being monitored Patient Re-evaluated:Patient Re-evaluated prior to induction Oxygen Delivery Method: Circle system utilized Preoxygenation: Pre-oxygenation with 100% oxygen Induction Type: IV induction Ventilation: Mask ventilation without difficulty LMA: LMA inserted LMA Size: 4.0 Number of attempts: 1 Placement Confirmation: positive ETCO2 and breath sounds checked- equal and bilateral Tube secured with: Tape Dental Injury: Teeth and Oropharynx as per pre-operative assessment

## 2019-07-24 NOTE — Anesthesia Procedure Notes (Signed)
Anesthesia Regional Block: Popliteal block   Pre-Anesthetic Checklist: ,, timeout performed, Correct Patient, Correct Site, Correct Laterality, Correct Procedure, Correct Position, site marked, Risks and benefits discussed, pre-op evaluation,  At surgeon's request and post-op pain management  Laterality: Right  Prep: Maximum Sterile Barrier Precautions used, chloraprep       Needles:  Injection technique: Single-shot  Needle Type: Echogenic Stimulator Needle     Needle Length: 9cm  Needle Gauge: 21     Additional Needles:   Procedures:, nerve stimulator,,, ultrasound used (permanent image in chart),,,,   Nerve Stimulator or Paresthesia:  Response: Peroneal,  Response: Tibial,   Additional Responses:   Narrative:  Start time: 07/24/2019 11:14 AM End time: 07/24/2019 11:24 AM Injection made incrementally with aspirations every 5 mL. Anesthesiologist: Gaynelle Adu, MD  Additional Notes: 2% Lidocaine skin wheel. Adductor Canal block with 15cc of 0.5% Bupivicaine w/1:200kepi.

## 2019-07-24 NOTE — Anesthesia Preprocedure Evaluation (Addendum)
Anesthesia Evaluation  Patient identified by MRN, date of birth, ID band Patient awake    Reviewed: Allergy & Precautions, H&P , NPO status , Patient's Chart, lab work & pertinent test results  History of Anesthesia Complications (+) PONV  Airway Mallampati: II  TM Distance: >3 FB Neck ROM: Full    Dental no notable dental hx. (+) Teeth Intact, Dental Advisory Given   Pulmonary neg pulmonary ROS,    Pulmonary exam normal breath sounds clear to auscultation       Cardiovascular hypertension, Pt. on medications  Rhythm:Regular Rate:Normal     Neuro/Psych Depression negative neurological ROS     GI/Hepatic negative GI ROS, Neg liver ROS,   Endo/Other  negative endocrine ROS  Renal/GU negative Renal ROS  negative genitourinary   Musculoskeletal  (+) Arthritis , Osteoarthritis,    Abdominal   Peds  Hematology  (+) Blood dyscrasia, anemia ,   Anesthesia Other Findings   Reproductive/Obstetrics negative OB ROS                            Anesthesia Physical Anesthesia Plan  ASA: II  Anesthesia Plan: General   Post-op Pain Management:  Regional for Post-op pain   Induction: Intravenous  PONV Risk Score and Plan: 4 or greater and Ondansetron, Dexamethasone, Propofol infusion, Midazolam and Scopolamine patch - Pre-op  Airway Management Planned: LMA  Additional Equipment:   Intra-op Plan:   Post-operative Plan: Extubation in OR  Informed Consent: I have reviewed the patients History and Physical, chart, labs and discussed the procedure including the risks, benefits and alternatives for the proposed anesthesia with the patient or authorized representative who has indicated his/her understanding and acceptance.     Dental advisory given  Plan Discussed with: CRNA  Anesthesia Plan Comments:        Anesthesia Quick Evaluation

## 2019-07-24 NOTE — Progress Notes (Signed)
Assisted Dr. Autumn Patty with right, ultrasound guided, popliteal and adductor canal block. Side rails up, monitors on throughout procedure. See vital signs in flow sheet. Tolerated Procedure well.

## 2019-07-24 NOTE — Interval H&P Note (Signed)
History and Physical Interval Note:  07/24/2019 1:01 PM  Taylor [name]  has presented today for surgery, with the diagnosis of Right ankle fracture.  The various methods of treatment have been discussed with the patient and family. After consideration of risks, benefits and other options for treatment, the patient has consented to  Procedure(s): OPEN REDUCTION INTERNAL FIXATION (ORIF) ANKLE FRACTURE (Right) as a surgical intervention.  The patient's history has been reviewed, patient examined, no change in status, stable for surgery.  I have reviewed the patient's chart and labs.  Questions were answered to the patient's satisfaction.     [name]

## 2019-07-24 NOTE — Discharge Instructions (Signed)
Elevate the right foot constantly on pillows.  Keep the splint clean and dry. Wiggle toes to promote circulation  Take the baby aspirin twice daily and wear a compression sock on the left leg to prevent blood clots.   Move the left leg a lot to prevent clots  Strict Non Weight Bearing on the right leg  Follow up with Dr Ranell Patrick in two weeks in the office, call [phone] for appt    No Tylenol until 4:30pm    Post Anesthesia Home Care Instructions  Activity: Get plenty of rest for the remainder of the day. A responsible individual must stay with you for 24 hours following the procedure.  For the next 24 hours, DO NOT: -Drive a car -Advertising copywriter -Drink alcoholic beverages -Take any medication unless instructed by your physician -Make any legal decisions or sign important papers.  Meals: Start with liquid foods such as gelatin or soup. Progress to regular foods as tolerated. Avoid greasy, spicy, heavy foods. If nausea and/or vomiting occur, drink only clear liquids until the nausea and/or vomiting subsides. Call your physician if vomiting continues.  Special Instructions/Symptoms: Your throat may feel dry or sore from the anesthesia or the breathing tube placed in your throat during surgery. If this causes discomfort, gargle with warm salt water. The discomfort should disappear within 24 hours.  If you had a scopolamine patch placed behind your ear for the management of post- operative nausea and/or vomiting:  1. The medication in the patch is effective for 72 hours, after which it should be removed.  Wrap patch in a tissue and discard in the trash. Wash hands thoroughly with soap and water. 2. You may remove the patch earlier than 72 hours if you experience unpleasant side effects which may include dry mouth, dizziness or visual disturbances. 3. Avoid touching the patch. Wash your hands with soap and water after contact with the patch.      Regional Anesthesia  Blocks  1. Numbness or the inability to move the "blocked" extremity may last from 3-48 hours after placement. The length of time depends on the medication injected and your individual response to the medication. If the numbness is not going away after 48 hours, call your surgeon.  2. The extremity that is blocked will need to be protected until the numbness is gone and the  Strength has returned. Because you cannot feel it, you will need to take extra care to avoid injury. Because it may be weak, you may have difficulty moving it or using it. You may not know what position it is in without looking at it while the block is in effect.  3. For blocks in the legs and feet, returning to weight bearing and walking needs to be done carefully. You will need to wait until the numbness is entirely gone and the strength has returned. You should be able to move your leg and foot normally before you try and bear weight or walk. You will need someone to be with you when you first try to ensure you do not fall and possibly risk injury.  4. Bruising and tenderness at the needle site are common side effects and will resolve in a few days.  5. Persistent numbness or new problems with movement should be communicated to the surgeon or the [facility] [phone] [facility] [phone]).

## 2019-07-24 NOTE — Brief Op Note (Signed)
07/24/2019  2:48 PM  PATIENT:  Taylor [name]  59 y.o. female  PRE-OPERATIVE DIAGNOSIS:  Right ankle fracture, displaced trimalleolar   POST-OPERATIVE DIAGNOSIS:  Right ankle fracture, displaced trimalleolar   PROCEDURE:  Procedure(s): OPEN REDUCTION INTERNAL FIXATION (ORIF) ANKLE FRACTURE (Right)   SURGEON:  Surgeon(s) and Role:    Beverely Low, MD - Primary  PHYSICIAN ASSISTANT:   ASSISTANTS: thomas B Dixon, PA-C   ANESTHESIA:   regional and general  EBL:  30 mL   BLOOD ADMINISTERED:none  DRAINS: none   LOCAL MEDICATIONS USED:  NONE  SPECIMEN:  No Specimen  DISPOSITION OF SPECIMEN:  N/A  COUNTS:  YES  TOURNIQUET:   Total Tourniquet Time Documented: Thigh (Right) - 50 minutes Total: Thigh (Right) - 50 minutes   DICTATION: [id number]  PLAN OF CARE: Discharge to home after PACU  PATIENT DISPOSITION:  PACU - hemodynamically stable.   Delay start of Pharmacological VTE agent (>24hrs) due to surgical blood loss or risk of bleeding: no

## 2019-07-24 NOTE — Transfer of Care (Signed)
Immediate Anesthesia Transfer of Care Note  Patient: Taylor [name]  Procedure(s) Performed: OPEN REDUCTION INTERNAL FIXATION (ORIF) ANKLE FRACTURE (Right Ankle)  Patient Location: PACU  Anesthesia Type:GA combined with regional for post-op pain  Level of Consciousness: awake, alert , oriented and patient cooperative  Airway & Oxygen Therapy: Patient Spontanous Breathing and Patient connected to nasal cannula oxygen  Post-op Assessment: Report given to RN, Post -op Vital signs reviewed and stable and Patient moving all extremities  Post vital signs: Reviewed and stable  Last Vitals:  Vitals Value Taken Time  BP 106/69 07/24/19 1438  Temp    Pulse 75 07/24/19 1440  Resp 13 07/24/19 1440  SpO2 100 % 07/24/19 1440  Vitals shown include unvalidated device data.  Last Pain:  Vitals:   07/24/19 1016  TempSrc: Oral  PainSc: 0-No pain      Patients Stated Pain Goal: 3 (07/24/19 1016)  Complications: No apparent anesthesia complications

## 2019-07-24 NOTE — Anesthesia Postprocedure Evaluation (Signed)
Anesthesia Post Note  Patient: Taylor [name]  Procedure(s) Performed: OPEN REDUCTION INTERNAL FIXATION (ORIF) ANKLE FRACTURE (Right Ankle)     Patient location during evaluation: PACU Anesthesia Type: General and Regional Level of consciousness: awake and alert Pain management: pain level controlled Vital Signs Assessment: post-procedure vital signs reviewed and stable Respiratory status: spontaneous breathing, nonlabored ventilation and respiratory function stable Cardiovascular status: blood pressure returned to baseline and stable Postop Assessment: no apparent nausea or vomiting Anesthetic complications: no    Last Vitals:  Vitals:   07/24/19 1500 07/24/19 1515  BP: 114/60 118/80  Pulse: 64 62  Resp: 11 12  Temp:    SpO2: 93% 100%    Last Pain:  Vitals:   07/24/19 1500  TempSrc:   PainSc: 0-No pain                 Isma Tietje,W. EDMOND

## 2019-07-25 NOTE — Op Note (Signed)
[name], Taylor [name] MEDICAL RECORD [id number] ACCOUNT [id number] DATE OF BIRTH:March 28, 1961 FACILITY: [facility] LOCATION: [facility] PHYSICIAN:STEVEN Russ Halo, MD  OPERATIVE REPORT  DATE OF PROCEDURE:  07/24/2019  PREOPERATIVE DIAGNOSIS:  Displaced right trimalleolar ankle fracture.  POSTOPERATIVE DIAGNOSIS:  Displaced right trimalleolar ankle fracture.  PROCEDURE PERFORMED:  Open reduction internal fixation of right trimalleolar ankle fracture with fibular plate, Biomet.  ATTENDING SURGEON:  Malon Kindle, MD  ASSISTANT:  Modesto Charon, [place], who was scrubbed during the entire procedure and necessary for satisfactory completion of surgery.  ANESTHESIA:  General anesthesia, plus a popliteal block was used.  ESTIMATED BLOOD LOSS:  Minimal.  FLUID REPLACEMENT:  1000 mL crystalloid.  INSTRUMENT COUNTS:  Correct.  COMPLICATIONS:  There were no complications.  ANTIBIOTICS:  Perioperative antibiotics were given.  INDICATIONS:  The patient is a 59 year old female who suffered a ground level fall, injuring her right ankle.  She presented to the emergency room with a grossly deformed fracture dislocation of the right ankle.  She had emergent reduction performed and  was closed and neurovascularly intact.  She was referred for outpatient orthopedics with an intact mortise.  The patient did have a Weber B fibular fracture with an avulsion off the medial malleolus and a posterior malleolus fracture.  I discussed with  the patient that this is an unstable pattern and that her fibula was short and recommended surgical management with a fixation of her fibula, which would stabilize the entire fracture pattern.  Risks and benefits of the surgery were discussed in detail  with the patient, informed consent obtained.  DESCRIPTION OF PROCEDURE:  After an adequate level of anesthesia was achieved, the patient was positioned in the supine position.  A bump was placed under the right hip.  A  nonsterile tourniquet was placed on right proximal thigh.  Right leg sterilely  prepped and draped in the usual manner.  Time-out called, verifying correct patient and correct site.  We elevated the leg and exsanguinated with an Esmarch bandage, elevating the tourniquet to 300 mmHg.  We made a longitudinal incision over the  subcutaneous ____, dissection down through subcutaneous tissue straight to bone with subperiosteal dissection.  Fractured fibula was identified.  It was short and rotated.  We irrigated, cleaned out the fracture hematoma from the fracture site and then  reduced the fracture with a crab claw clamp and distal pole.  With the fracture reduced, we placed a 5-hole one-third tubular plate on the posterior aspect of the fibula to serve as an antiglide plate.  We then placed 3 screws bicortical into the  proximal fragment, locking the fibula into place.  We had a nicely reduced the fibula anatomically with an intact mortise.  We used multiplanar C-arm to verify reduction and plate and screw placement.  We irrigated again and then closed in layers with 0  Vicryl deep closure, 2-0 Vicryl subcutaneous closure and 4-0 Monocryl for skin.  Steri-Strips and a sterile bandage, as well as a short leg splint applied with the patient still asleep.  We awakened the patient after the splint was fully hardened and was  transported to the recovery room in stable condition.  VN/NUANCE  D:07/24/2019 T:07/24/2019 JOB:[id number]

## 2019-07-27 ENCOUNTER — Encounter: Payer: Self-pay | Admitting: *Deleted

## 2020-12-15 ENCOUNTER — Other Ambulatory Visit: Payer: Self-pay | Admitting: Family Medicine

## 2020-12-15 DIAGNOSIS — Z1239 Encounter for other screening for malignant neoplasm of breast: Secondary | ICD-10-CM

## 2020-12-22 IMAGING — CR DG ANKLE COMPLETE 3+V*R*
3 series · 3 of 3 positions shown · non-contrast
Comparison: None.

CLINICAL DATA: 58-year-old female with fall and trauma to the right
ankle.

EXAM:
RIGHT ANKLE - COMPLETE 3+ VIEW

[x ankle lat right]
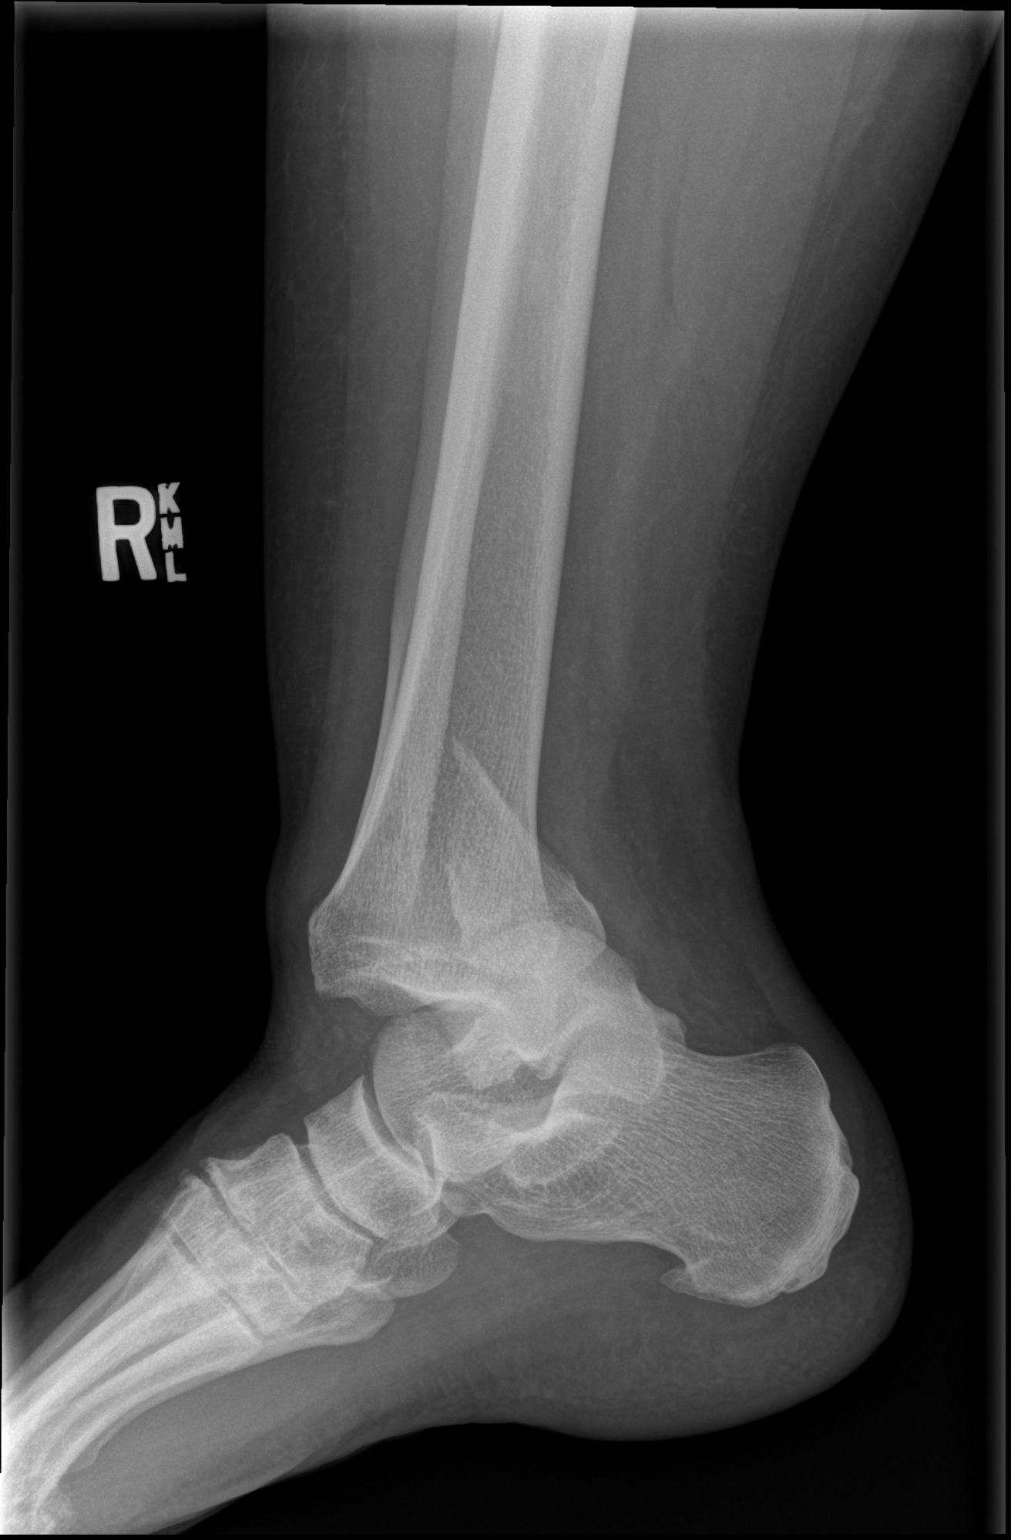

[x ankle ap right]
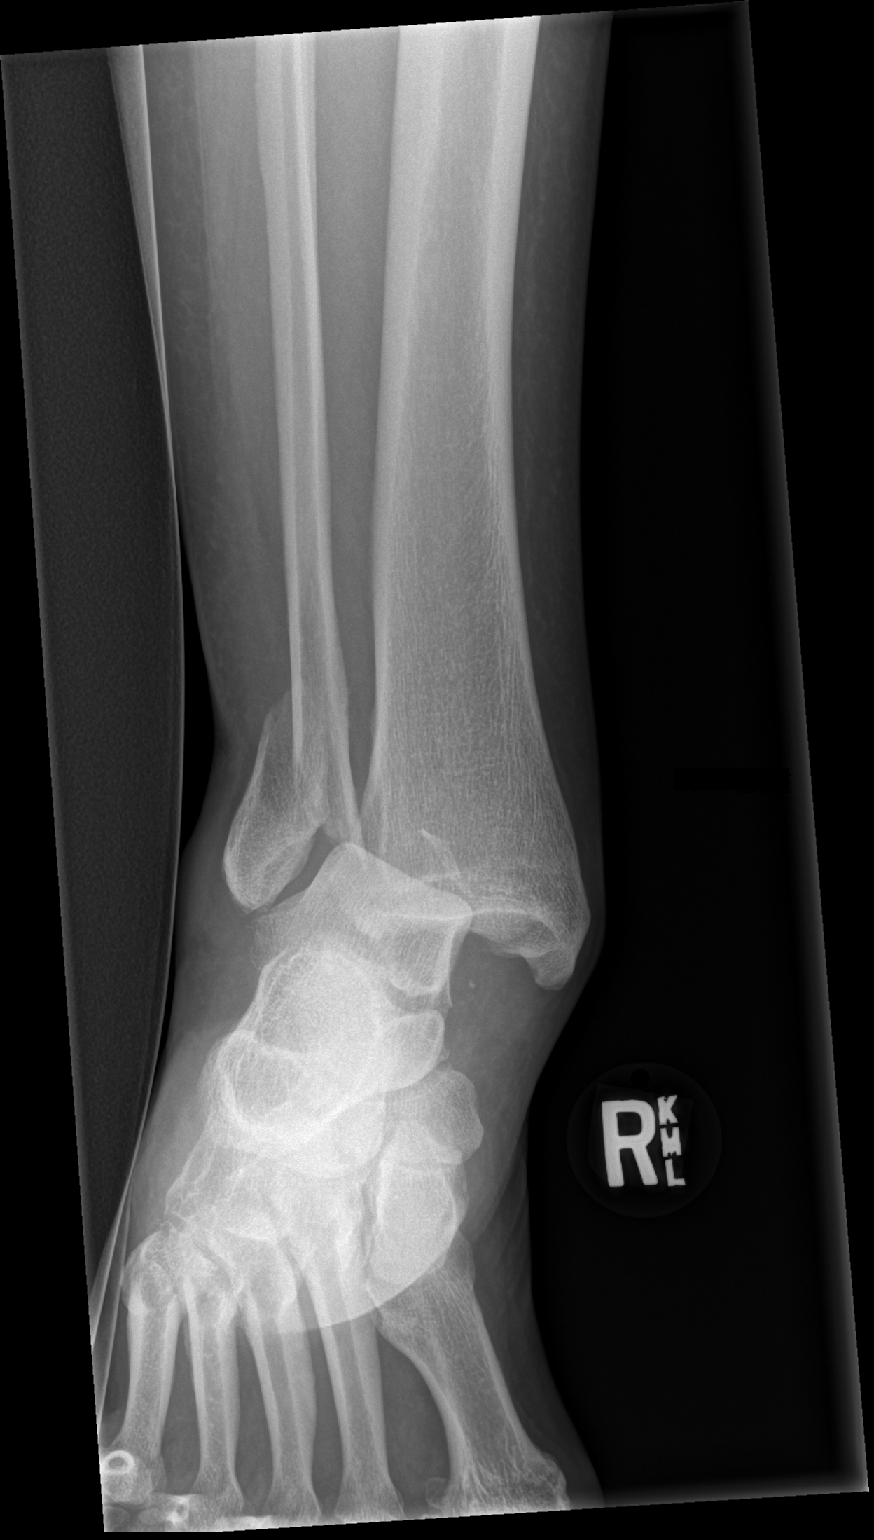

[x ankle obl right]
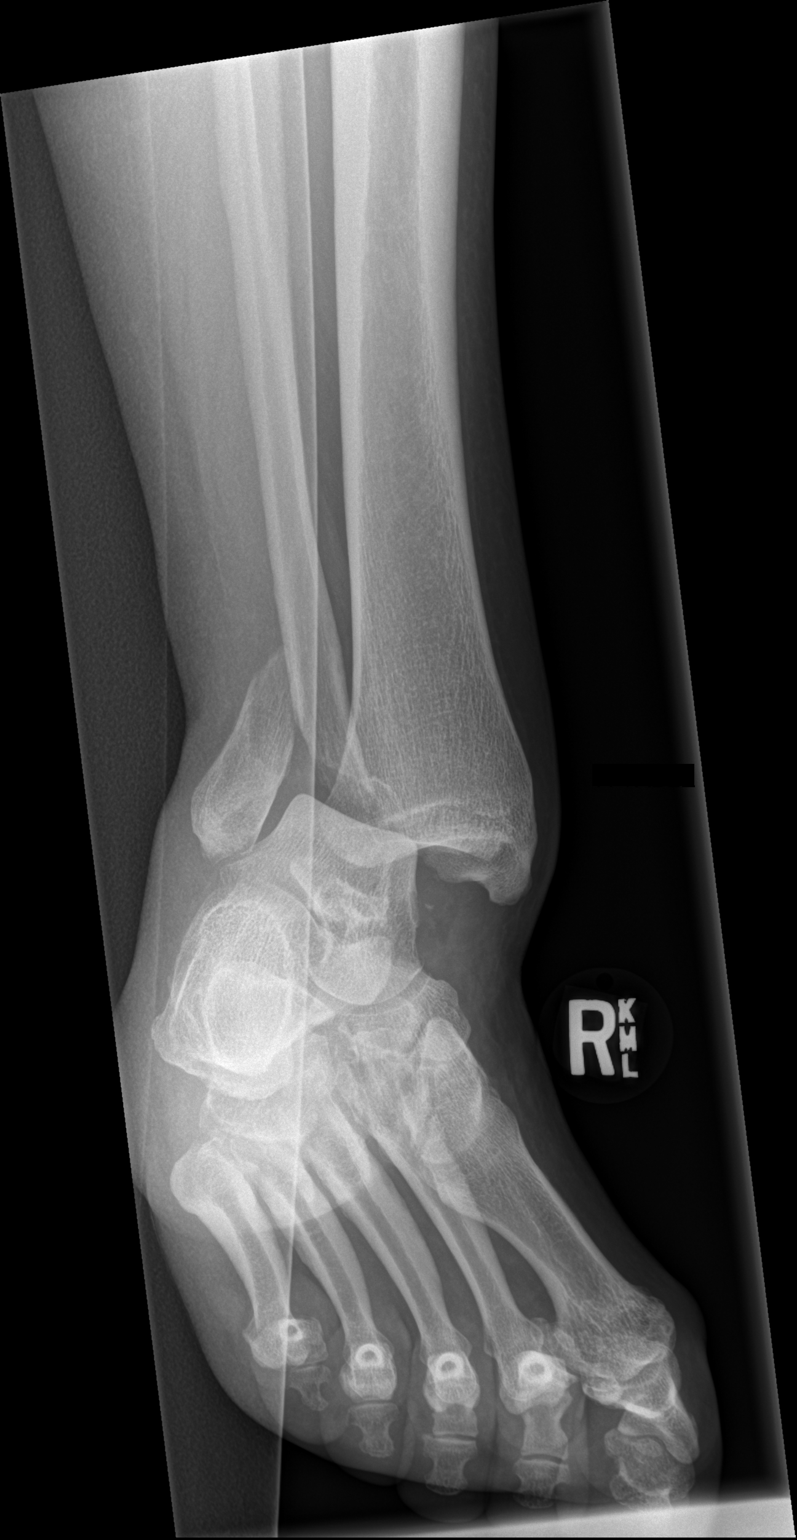

[3 of 3 positions shown; findings below may reference images not displayed]

FINDINGS: There is a displaced and angulated fracture of the distal fibula
with lateral angulation of the distal fracture fragment. A fracture
of the posterior malleolus and possibly medial malleolus suspected
but not seen with certainty. A tiny bone fragment medial to the
talus likely arises from the medial malleolus or talar cortex. There
is lateral angulation and dislocation of the ankle mortise. There is
posterior displacement of the talar dome in relation to the tibial
plafond. Degenerative changes of the tarsal joints noted. There is
soft tissue swelling of the ankle.
IMPRESSION: 1. Angulated and displaced fracture of the distal fibula with
possible fracture of posterior or medial malleoli. CT may provide
better evaluation if clinically indicated.
2. Lateral and posterior dislocation of the ankle mortise.

## 2022-04-23 ENCOUNTER — Emergency Department (HOSPITAL_COMMUNITY): Payer: BC Managed Care – PPO

## 2022-04-23 ENCOUNTER — Emergency Department (HOSPITAL_COMMUNITY)
Admission: EM | Admit: 2022-04-23 | Discharge: 2022-04-23 | Disposition: A | Discharge status: Home / Self Care | Payer: BC Managed Care – PPO | Attending: Emergency Medicine | Admitting: Emergency Medicine

## 2022-04-23 ENCOUNTER — Other Ambulatory Visit: Payer: Self-pay

## 2022-04-23 DIAGNOSIS — Z7982 Long term (current) use of aspirin: Secondary | ICD-10-CM | POA: Diagnosis not present

## 2022-04-23 DIAGNOSIS — K529 Noninfective gastroenteritis and colitis, unspecified: Secondary | ICD-10-CM | POA: Diagnosis not present

## 2022-04-23 DIAGNOSIS — D72829 Elevated white blood cell count, unspecified: Secondary | ICD-10-CM | POA: Insufficient documentation

## 2022-04-23 DIAGNOSIS — Z79899 Other long term (current) drug therapy: Secondary | ICD-10-CM | POA: Insufficient documentation

## 2022-04-23 DIAGNOSIS — I10 Essential (primary) hypertension: Secondary | ICD-10-CM | POA: Insufficient documentation

## 2022-04-23 DIAGNOSIS — R109 Unspecified abdominal pain: Secondary | ICD-10-CM | POA: Diagnosis present

## 2022-04-23 LAB — CBC
HCT: 46.1 % — ABNORMAL HIGH (ref 36.0–46.0)
Hemoglobin: 15.4 g/dL — ABNORMAL HIGH (ref 12.0–15.0)
MCH: 29.2 pg (ref 26.0–34.0)
MCHC: 33.4 g/dL (ref 30.0–36.0)
MCV: 87.3 fL (ref 80.0–100.0)
Platelets: 355 10*3/uL (ref 150–400)
RBC: 5.28 MIL/uL — ABNORMAL HIGH (ref 3.87–5.11)
RDW: 12.4 % (ref 11.5–15.5)
WBC: 13.3 10*3/uL — ABNORMAL HIGH (ref 4.0–10.5)
nRBC: 0 % (ref 0.0–0.2)

## 2022-04-23 LAB — COMPREHENSIVE METABOLIC PANEL
ALT: 18 U/L (ref 0–44)
AST: 18 U/L (ref 15–41)
Albumin: 4.1 g/dL (ref 3.5–5.0)
Alkaline Phosphatase: 85 U/L (ref 38–126)
Anion gap: 10 (ref 5–15)
BUN: 13 mg/dL (ref 8–23)
CO2: 27 mmol/L (ref 22–32)
Calcium: 9.5 mg/dL (ref 8.9–10.3)
Chloride: 100 mmol/L (ref 98–111)
Creatinine, Ser: 1.1 mg/dL — ABNORMAL HIGH (ref 0.44–1.00)
GFR, Estimated: 57 mL/min — ABNORMAL LOW (ref >60)
Glucose, Bld: 129 mg/dL — ABNORMAL HIGH (ref 70–99)
Potassium: 3 mmol/L — ABNORMAL LOW (ref 3.5–5.1)
Sodium: 137 mmol/L (ref 135–145)
Total Bilirubin: 1.1 mg/dL (ref 0.3–1.2)
Total Protein: 7.6 g/dL (ref 6.5–8.1)

## 2022-04-23 LAB — LIPASE, BLOOD: Lipase: 28 U/L (ref 11–51)

## 2022-04-23 MED ORDER — ONDANSETRON 4 MG PO TBDP: 4.0000 mg | ORAL_TABLET | Freq: Three times a day (TID) | ORAL | 0 refills | Status: AC | PRN

## 2022-04-23 MED ORDER — AMOXICILLIN-POT CLAVULANATE 875-125 MG PO TABS
1.0000 | ORAL_TABLET | Freq: Once | ORAL | Status: AC
  Administered 2022-04-23: 1 via ORAL
  Filled 2022-04-23: qty 1

## 2022-04-23 MED ORDER — OXYCODONE HCL 5 MG PO TABS
5.0000 mg | ORAL_TABLET | Freq: Once | ORAL | Status: DC
  Filled 2022-04-23: qty 1

## 2022-04-23 MED ORDER — AMOXICILLIN-POT CLAVULANATE 875-125 MG PO TABS: 1.0000 | ORAL_TABLET | Freq: Two times a day (BID) | ORAL | 0 refills | Status: AC

## 2022-04-23 MED ORDER — OXYCODONE HCL 5 MG PO TABS
5.0000 mg | ORAL_TABLET | Freq: Once | ORAL | Status: AC
  Administered 2022-04-23: 5 mg via ORAL
  Filled 2022-04-23: qty 1

## 2022-04-23 MED ORDER — OXYCODONE HCL 5 MG PO TABS: 5.0000 mg | ORAL_TABLET | Freq: Four times a day (QID) | ORAL | 0 refills | Status: AC | PRN

## 2022-04-23 MED ORDER — IOHEXOL 300 MG/ML  SOLN
100.0000 mL | Freq: Once | INTRAMUSCULAR | Status: AC | PRN
  Administered 2022-04-23: 100 mL via INTRAVENOUS

## 2022-04-23 MED ORDER — ONDANSETRON 4 MG PO TBDP
4.0000 mg | ORAL_TABLET | Freq: Once | ORAL | Status: AC
  Administered 2022-04-23: 4 mg via ORAL
  Filled 2022-04-23: qty 1

## 2022-04-23 NOTE — ED Triage Notes (Addendum)
Pt reports abd pain with n/v, chills, diaphoretic, headache x2 days Lbm Wednesday.

## 2022-04-23 NOTE — Discharge Instructions (Addendum)
Follow-up with your gastroenterologist.  Take antibiotic as prescribed.  Recommend 1000 mg of Tylenol every 6 hours as needed for pain.  Take Roxicodone for breakthrough pain.  This is a narcotic pain medicine so please be careful with its use.  Take Zofran as needed for nausea and vomiting.  Return if your symptoms worsen or you develop a high fever.

## 2022-04-23 NOTE — ED Provider Notes (Signed)
[facility]-EMERGENCY DEPT Provider Note   CSN: [id number] Arrival date & time: 04/23/22  1213     History  Chief Complaint  Patient presents with   Abdominal Pain    Taylor [name] is a 61 y.o. female.  Patient here with abdominal pain, nausea, vomiting.  History of hypertension.  No suspicious food intake.  She has been somewhat constipated.  Denies any headaches, chest pain, shortness of breath.  She has had some body aches.  Denies any weakness or numbness or vision changes.  Nothing makes it worse or better.  Symptoms in the last 2 days.        Home Medications Prior to Admission medications   Medication Sig Start Date End Date Taking? Authorizing Provider  amoxicillin-clavulanate (AUGMENTIN) 875-125 MG tablet Take 1 tablet by mouth every 12 (twelve) hours. 04/23/22  Yes Shirl Ludington, DO  ondansetron (ZOFRAN-ODT) 4 MG disintegrating tablet Take 1 tablet (4 mg total) by mouth every 8 (eight) hours as needed for nausea or vomiting. 04/23/22  Yes Naythan Douthit, DO  oxyCODONE (ROXICODONE) 5 MG immediate release tablet Take 1 tablet (5 mg total) by mouth every 6 (six) hours as needed for up to 10 days for breakthrough pain. 04/23/22 05/03/22 Yes Conn Trombetta, DO  amphetamine-dextroamphetamine (ADDERALL XR) 20 MG 24 hr capsule Take 20 mg by mouth 2 (two) times daily. 05/29/16   [provider]  aspirin 81 MG chewable tablet Chew 1 tablet (81 mg total) by mouth 2 (two) times daily. 07/24/19   Beverely Low, MD  buPROPion (WELLBUTRIN XL) 300 MG 24 hr tablet Take 300 mg by mouth daily.  06/16/16   [provider]  celecoxib (CELEBREX) 100 MG capsule Take 100 mg by mouth daily.    [provider]  Cholecalciferol (VITAMIN D3) 10000 units TABS Take 10,000 Units by mouth daily.    [provider]  clonazePAM (KLONOPIN) 1 MG tablet Take 1 mg by mouth as needed for anxiety.    [provider]  docusate sodium (COLACE) 100  MG capsule Take 1 capsule (100 mg total) by mouth 2 (two) times daily. Patient not taking: Reported on 07/15/2019 07/04/16   Lanney Gins, PA-C  ferrous sulfate 325 (65 FE) MG tablet Take 1 tablet (325 mg total) by mouth 3 (three) times daily after meals. Patient not taking: Reported on 07/15/2019 07/04/16   Lanney Gins, PA-C  hydrochlorothiazide (HYDRODIURIL) 25 MG tablet Take 25 mg by mouth every morning.    [provider]  HYDROcodone-acetaminophen (NORCO) 5-325 MG tablet Take 1-2 tablets by mouth every 6 (six) hours as needed for moderate pain or severe pain. 07/24/19   Beverely Low, MD  methocarbamol (ROBAXIN) 500 MG tablet Take 1 tablet (500 mg total) by mouth every 6 (six) hours as needed for muscle spasms. Patient not taking: Reported on 07/15/2019 08/26/13   Lanney Gins, PA-C  olmesartan (BENICAR) 20 MG tablet Take 20 mg by mouth every morning.    [provider]  polyethylene glycol (MIRALAX / GLYCOLAX) packet Take 17 g by mouth 2 (two) times daily. Patient not taking: Reported on 07/15/2019 07/04/16   Lanney Gins, PA-C  vitamin B-12 (CYANOCOBALAMIN) 1000 MCG tablet Take 1,000 mcg by mouth daily.    [provider]      Allergies    Patient has no known allergies.    Review of Systems   Review of Systems  Physical Exam Updated Vital Signs BP (!) 142/98   Pulse (!) 101  Temp 98.9 F (37.2 C)   Resp 17   SpO2 97%  Physical Exam Vitals and nursing note reviewed.  Constitutional:      General: She is not in acute distress.    Appearance: She is well-developed. She is not ill-appearing.  HENT:     Head: Normocephalic and atraumatic.     Mouth/Throat:     Mouth: Mucous membranes are moist.  Eyes:     Conjunctiva/sclera: Conjunctivae normal.  Cardiovascular:     Rate and Rhythm: Normal rate and regular rhythm.     Heart sounds: Normal heart sounds. No murmur heard. Pulmonary:     Effort: Pulmonary effort is normal. No respiratory  distress.     Breath sounds: Normal breath sounds.  Abdominal:     Palpations: Abdomen is soft.     Tenderness: There is generalized abdominal tenderness.  Musculoskeletal:        General: No swelling.     Cervical back: Neck supple.  Skin:    General: Skin is warm and dry.     Capillary Refill: Capillary refill takes less than 2 seconds.  Neurological:     Mental Status: She is alert.  Psychiatric:        Mood and Affect: Mood normal.     ED Results / Procedures / Treatments   Labs (all labs ordered are listed, but only abnormal results are displayed) Labs Reviewed  COMPREHENSIVE METABOLIC PANEL - Abnormal; Notable for the following components:      Result Value   Potassium 3.0 (*)    Glucose, Bld 129 (*)    Creatinine, Ser 1.10 (*)    GFR, Estimated 57 (*)    All other components within normal limits  CBC - Abnormal; Notable for the following components:   WBC 13.3 (*)    RBC 5.28 (*)    Hemoglobin 15.4 (*)    HCT 46.1 (*)    All other components within normal limits  LIPASE, BLOOD    EKG None  Radiology CT ABDOMEN PELVIS W CONTRAST  Result Date: 04/23/2022 CLINICAL DATA:  Two days of abdominal pain, nausea, vomiting, chills, sweating, headache. EXAM: CT ABDOMEN AND PELVIS WITH CONTRAST TECHNIQUE: Multidetector CT imaging of the abdomen and pelvis was performed using the standard protocol following bolus administration of intravenous contrast. RADIATION DOSE REDUCTION: This exam was performed according to the departmental dose-optimization program which includes automated exposure control, adjustment of the mA and/or kV according to patient size and/or use of iterative reconstruction technique. CONTRAST:  OMNIPAQUE IOHEXOL 300 MG/ML  SOLN COMPARISON:  None Available. 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 The lung bases are clear. The imaged heart is unremarkable. Hepatobiliary: There are numerous hypodense  lesions in the liver, the larger of which can be characterized as simple cysts while the other small lesions are too small to definitively characterize but also likely reflect benign cysts. No specific imaging follow-up is required. The liver is otherwise unremarkable. The gallbladder is unremarkable. There is no biliary ductal dilatation. Pancreas: Unremarkable. Spleen: Unremarkable. Adrenals/Urinary Tract: The adrenals are unremarkable. The kidneys are unremarkable, with no focal lesion, stone, hydronephrosis, or hydroureter. There is symmetric excretion of contrast into the collecting systems on the delayed images. The bladder is obscured by streak artifact from pelvic hardware. Stomach/Bowel: The stomach is unremarkable there is no evidence of bowel obstruction there is wall thickening with mucosal hyperemia and surrounding fat stranding involving much of the descending colon consistent  with nonspecific infectious or inflammatory colitis. There is no other abnormal bowel wall thickening or inflammatory change. There are scattered sigmoid diverticuli without evidence of acute diverticulitis. The appendix is normal. Vascular/Lymphatic: The abdominal aorta is normal in course and caliber. The major branch vessels are patent. The main portal and splenic veins are patent. Incidental note is made of a circumaortic left renal vein there is no abdominal or pelvic lymphadenopathy. Reproductive: The uterus is not identified, presumed surgically absent. There is no definite adnexal mass, within the confines of streak artifact in the pelvis from adjacent hip hardware. Other: There is trace free fluid in the left paracolic gutter likely reactive related to the colitis. There is no other abdominal ascites. There is no free intraperitoneal air or evidence of abscess. Musculoskeletal: There is no acute osseous abnormality or suspicious osseous lesion. Bilateral hip arthroplasty hardware is noted. IMPRESSION: 1. Wall thickening  with surrounding fat stranding involving much of the descending colon consistent with nonspecific infectious or inflammatory colitis. 2. Scattered sigmoid diverticuli without evidence of acute diverticulitis. 3. Moderate-sized right-sided Morgagni hernia containing fat. Electronically Signed   By: Valetta Mole M.D.   On: 04/23/2022 14:57    Procedures Procedures    Medications Ordered in ED Medications  amoxicillin-clavulanate (AUGMENTIN) 875-125 MG per tablet 1 tablet (has no administration in time range)  oxyCODONE (Oxy IR/ROXICODONE) immediate release tablet 5 mg (has no administration in time range)  ondansetron (ZOFRAN-ODT) disintegrating tablet 4 mg (has no administration in time range)  iohexol (OMNIPAQUE) 300 MG/ML solution 100 mL (100 mLs Intravenous Contrast Given 04/23/22 1434)    ED Course/ Medical Decision Making/ A&P                           Medical Decision Making Risk Prescription drug management.   Sissy Goetzke is here with abdominal pain.  Normal vitals.  No fever.  Comorbidities include hypertension.  Differential diagnosis is colitis versus less likely bowel obstruction, appendicitis, UTI.  We will get CBC, CMP, lipase, CT scan abdomen pelvis.  Patient with mild leukocytosis per my review interpretation of labs.  Otherwise lab work is unremarkable.  CT scan shows wall thickening with surrounding fat stranding involving much of the descending colon consistent with a nonspecific infectious or inflammatory colitis.  Otherwise images unremarkable.  Will prescribe Augmentin, Roxicodone, Zofran.  She is able to tolerate p.o.  We will have her follow-up with GI doctor.  Discharged in good condition.  Understands return precautions.  This chart was dictated using voice recognition software.  Despite best efforts to proofread,  errors can occur which can change the documentation meaning.         Final Clinical Impression(s) / ED Diagnoses Final diagnoses:  Colitis     Rx / DC Orders ED Discharge Orders          Ordered    amoxicillin-clavulanate (AUGMENTIN) 875-125 MG tablet  Every 12 hours        04/23/22 2228    ondansetron (ZOFRAN-ODT) 4 MG disintegrating tablet  Every 8 hours PRN        04/23/22 2228    oxyCODONE (ROXICODONE) 5 MG immediate release tablet  Every 6 hours PRN        04/23/22 2228              Lennice Sites, DO 04/23/22 2231

## 2022-04-23 NOTE — ED Provider Triage Note (Signed)
Emergency Medicine Provider Triage Evaluation Note  Taylor [name] , a 61 y.o. female  was evaluated in triage.  Pt complains of abdominal pain x 2 days. Associated N/V, chills, diaphoresis, headache. Last BM on Wednesday of last week. Started passing some gas last night, but none before that. Tried 2 enemas with no relief. Hemorrhoids have been bleeding.   Review of Systems  Positive: Abd pain, N/V, chills, diaphoresis, headache, constipation Negative: Obstipation, urinary sx  Physical Exam  BP 122/85   Pulse (!) 105   Temp 98.6 F (37 C) (Oral)   Resp 17   SpO2 93%  Gen:   Awake, no distress   Resp:  Normal effort  MSK:   Moves extremities without difficulty  Other:    Medical Decision Making  Medically screening exam initiated at 12:59 PM.  Appropriate orders placed.  Taylor [name] was informed that the remainder of the evaluation will be completed by another provider, this initial triage assessment does not replace that evaluation, and the importance of remaining in the ED until their evaluation is complete.  Workup initiated including CT scan   Zamiyah Resendes T, PA-C 04/23/22 1301

## 2022-12-31 ENCOUNTER — Other Ambulatory Visit: Payer: Self-pay | Admitting: Family Medicine

## 2022-12-31 DIAGNOSIS — Z1231 Encounter for screening mammogram for malignant neoplasm of breast: Secondary | ICD-10-CM

## 2023-01-30 ENCOUNTER — Ambulatory Visit
Admission: RE | Admit: 2023-01-30 | Discharge: 2023-01-30 | Disposition: A | Discharge status: Home / Self Care | Payer: BC Managed Care – PPO | Source: Ambulatory Visit | Attending: Family Medicine | Admitting: Family Medicine

## 2023-01-30 DIAGNOSIS — Z1231 Encounter for screening mammogram for malignant neoplasm of breast: Secondary | ICD-10-CM
